# Patient Record
Sex: Male | Born: 1970 | Race: White | Hispanic: No | Marital: Married | Smoking: Never smoker
Health system: Southern US, Community
[De-identification: ages and names within clinical notes are randomized; demographics above are authoritative.]

---

## 2014-04-09 ENCOUNTER — Emergency Department
Admit: 2014-04-09 | Disposition: A | Discharge status: Home / Self Care | Payer: Self-pay | Admitting: Emergency Medicine

## 2015-10-16 ENCOUNTER — Encounter: Payer: Self-pay | Admitting: Emergency Medicine

## 2015-10-16 ENCOUNTER — Emergency Department
Admission: EM | Admit: 2015-10-16 | Discharge: 2015-10-16 | Disposition: A | Discharge status: Home / Self Care | Payer: Medicaid Other | Attending: Emergency Medicine | Admitting: Emergency Medicine

## 2015-10-16 DIAGNOSIS — K047 Periapical abscess without sinus: Secondary | ICD-10-CM | POA: Insufficient documentation

## 2015-10-16 DIAGNOSIS — K0889 Other specified disorders of teeth and supporting structures: Secondary | ICD-10-CM | POA: Diagnosis present

## 2015-10-16 MED ORDER — AMOXICILLIN 500 MG PO CAPS: 500.0000 mg | ORAL_CAPSULE | Freq: Three times a day (TID) | ORAL | 0 refills | Status: DC

## 2015-10-16 MED ORDER — TRAMADOL HCL 50 MG PO TABS: 50.0000 mg | ORAL_TABLET | Freq: Four times a day (QID) | ORAL | 0 refills | Status: DC | PRN

## 2015-10-16 NOTE — ED Notes (Signed)
NAD noted at time of D/C. Pt denies questions or concerns. Pt ambulatory to the lobby at this time.  

## 2015-10-16 NOTE — ED Triage Notes (Signed)
Pt c/o dental pain on the left side. Pt states he has been going to [facility] for dental care but states he thinks he has an infection.  Pt states pain is ongoing.

## 2015-10-16 NOTE — Discharge Instructions (Signed)
Plan take antibiotics as directed. Continue your dental care at [facility] or not.

## 2015-10-16 NOTE — ED Provider Notes (Signed)
[facility] Emergency Department Provider Note  ____________________________________________   First MD Initiated Contact with Patient 10/16/15 1442     (approximate)  I have reviewed the triage vital signs and the nursing notes.   HISTORY  Chief Complaint Dental Pain    HPI Bruce Lindsey is a 45 y.o. male is here with complaint of right-sided dental pain. Patient states that he's been going to [facility] for some time and has been having extractions. He states that several nights ago after eating he noticed some pain on the right lower side where he still has  caries.Patient has been taking aspirin and also using a dental paste to avoid letting air near his tooth. Today he started noticing some swelling on the right side of his face. He denies any fever or chills and there is been no nausea or vomiting. He plans on continuing his ongoing dental procedures at [facility] and getting dentures. Currently he rates his pain as an 8 out of 10.   History reviewed. No pertinent past medical history.  There are no active problems to display for this patient.   History reviewed. No pertinent surgical history.  Prior to Admission medications   Medication Sig Start Date End Date Taking? Authorizing Provider  amoxicillin (AMOXIL) 500 MG capsule Take 1 capsule (500 mg total) by mouth 3 (three) times daily. 10/16/15   Tommi Rumpshonda L Tamakia Porto, PA-C  traMADol (ULTRAM) 50 MG tablet Take 1 tablet (50 mg total) by mouth every 6 (six) hours as needed. 10/16/15   Tommi Rumpshonda L Afomia Blackley, PA-C    Allergies Review of patient's allergies indicates no known allergies.  History reviewed. No pertinent family history.  Social History Social History  Substance Use Topics  . Smoking status: Never Smoker  . Smokeless tobacco: Never Used  . Alcohol use No    Review of Systems Constitutional: No fever/chills ENT: Positive dental pain. Cardiovascular: Denies chest  pain. Respiratory: Denies shortness of breath. Gastrointestinal:   No nausea, no vomiting.   Neurological: Negative for headaches  10-point ROS otherwise negative.  ____________________________________________   PHYSICAL EXAM:  VITAL SIGNS: ED Triage Vitals  Enc Vitals Group     BP 10/16/15 1328 131/88     Pulse Rate 10/16/15 1328 93     Resp 10/16/15 1328 18     Temp 10/16/15 1328 98.1 F (36.7 C)     Temp Source 10/16/15 1328 Oral     SpO2 10/16/15 1328 99 %     Weight 10/16/15 1330 203 lb (92.1 kg)     Height 10/16/15 1330 5\' 9"  (1.753 m)     Head Circumference --      Peak Flow --      Pain Score 10/16/15 1329 8     Pain Loc --      Pain Edu? --      Excl. in GC? --     Constitutional: Alert and oriented. Well appearing and in no acute distress. Eyes: Conjunctivae are normal. PERRL. EOMI. Head: Atraumatic. Nose: No congestion/rhinnorhea. Mouth/Throat: Mucous membranes are moist.  Oropharynx non-erythematous. Overall poor dental hygiene with multiple extractions noted. Right lower molar with dental paste present. The gum surrounding the tooth is edematous and tender to touch. No localized abscess is noted. There is facial edema right mandibular area. Neck: No stridor.   Cardiovascular: Normal rate, regular rhythm. Grossly normal heart sounds.  Good peripheral circulation. Respiratory: Normal respiratory effort.  No retractions. Lungs CTAB. Musculoskeletal:  His upper and lower extremities without any difficulty. Normal gait was noted. Neurologic:  Normal speech and language. No gross focal neurologic deficits are appreciated. No gait instability. Skin:  Skin is warm, dry and intact. No rash noted. Psychiatric: Mood and affect are normal. Speech and behavior are normal.  ____________________________________________   LABS (all labs ordered are listed, but only abnormal results are displayed)  Labs Reviewed - No data to display   PROCEDURES  Procedure(s)  performed: None  Procedures  Critical Care performed: No  ____________________________________________   INITIAL IMPRESSION / ASSESSMENT AND PLAN / ED COURSE  Pertinent labs & imaging results that were available during my care of the patient were reviewed by me and considered in my medical decision making (see chart for details).    Clinical Course   Patient was given a prescription for amoxicillin 500 mg 3 times a day for 10 days and tramadol as needed for pain. Patient is to make appointment with [facility] dental clinic and continue his dental care.   ____________________________________________   FINAL CLINICAL IMPRESSION(S) / ED DIAGNOSES  Final diagnoses:  Dental abscess      NEW MEDICATIONS STARTED DURING THIS VISIT:  New Prescriptions   AMOXICILLIN (AMOXIL) 500 MG CAPSULE    Take 1 capsule (500 mg total) by mouth 3 (three) times daily.   TRAMADOL (ULTRAM) 50 MG TABLET    Take 1 tablet (50 mg total) by mouth every 6 (six) hours as needed.     Note:  This document was prepared using Dragon voice recognition software and may include unintentional dictation errors.    Tommi Rumps, PA-C 10/16/15 1510    Arnaldo Natal, MD 10/20/15 [phone]

## 2019-05-28 DIAGNOSIS — R824 Acetonuria: Secondary | ICD-10-CM | POA: Diagnosis present

## 2019-05-28 DIAGNOSIS — Z20822 Contact with and (suspected) exposure to covid-19: Secondary | ICD-10-CM | POA: Diagnosis present

## 2019-05-28 DIAGNOSIS — K8051 Calculus of bile duct without cholangitis or cholecystitis with obstruction: Secondary | ICD-10-CM | POA: Diagnosis present

## 2019-05-28 DIAGNOSIS — K851 Biliary acute pancreatitis without necrosis or infection: Principal | ICD-10-CM | POA: Diagnosis present

## 2019-05-28 LAB — COMPREHENSIVE METABOLIC PANEL
ALT: 397 U/L — ABNORMAL HIGH (ref 0–44)
AST: 154 U/L — ABNORMAL HIGH (ref 15–41)
Albumin: 4.4 g/dL (ref 3.5–5.0)
Alkaline Phosphatase: 152 U/L — ABNORMAL HIGH (ref 38–126)
Anion gap: 11 (ref 5–15)
BUN: 9 mg/dL (ref 6–20)
CO2: 26 mmol/L (ref 22–32)
Calcium: 9.1 mg/dL (ref 8.9–10.3)
Chloride: 102 mmol/L (ref 98–111)
Creatinine, Ser: 1.09 mg/dL (ref 0.61–1.24)
GFR calc Af Amer: >60 mL/min (ref >60)
GFR calc non Af Amer: >60 mL/min (ref >60)
Glucose, Bld: 140 mg/dL — ABNORMAL HIGH (ref 70–99)
Potassium: 3.9 mmol/L (ref 3.5–5.1)
Sodium: 139 mmol/L (ref 135–145)
Total Bilirubin: 4.3 mg/dL — ABNORMAL HIGH (ref 0.3–1.2)
Total Protein: 8.2 g/dL — ABNORMAL HIGH (ref 6.5–8.1)

## 2019-05-28 LAB — URINALYSIS, COMPLETE (UACMP) WITH MICROSCOPIC
Bacteria, UA: NONE SEEN
Bilirubin Urine: NEGATIVE
Glucose, UA: NEGATIVE mg/dL
Ketones, ur: 20 mg/dL — AB
Leukocytes,Ua: NEGATIVE
Nitrite: NEGATIVE
Protein, ur: NEGATIVE mg/dL
Specific Gravity, Urine: 1.008 (ref 1.005–1.030)
Squamous Epithelial / LPF: NONE SEEN (ref 0–5)
pH: 5 (ref 5.0–8.0)

## 2019-05-28 LAB — CBC
HCT: 51 % (ref 39.0–52.0)
Hemoglobin: 17.2 g/dL — ABNORMAL HIGH (ref 13.0–17.0)
MCH: 29.6 pg (ref 26.0–34.0)
MCHC: 33.7 g/dL (ref 30.0–36.0)
MCV: 87.8 fL (ref 80.0–100.0)
Platelets: 232 10*3/uL (ref 150–400)
RBC: 5.81 MIL/uL (ref 4.22–5.81)
RDW: 13.3 % (ref 11.5–15.5)
WBC: 14.8 10*3/uL — ABNORMAL HIGH (ref 4.0–10.5)
nRBC: 0 % (ref 0.0–0.2)

## 2019-05-28 LAB — LIPASE, BLOOD: Lipase: 5577 U/L — ABNORMAL HIGH (ref 11–51)

## 2019-05-28 NOTE — ED Triage Notes (Signed)
Generalized abd cramping x 2 days with 2 episodes of vomiting. Nausea is intermittent in nature and pt reports he has been able to keep some fluids and food down. No diarrhea or fevers and no changes in urine.

## 2019-05-29 ENCOUNTER — Inpatient Hospital Stay (HOSPITAL_COMMUNITY)
Admission: AD | Admit: 2019-05-29 | Discharge: 2019-05-31 | DRG: 440 | Disposition: A | Discharge status: Home / Self Care | Payer: Self-pay | Source: Other Acute Inpatient Hospital | Attending: Internal Medicine | Admitting: Internal Medicine

## 2019-05-29 ENCOUNTER — Emergency Department: Payer: Self-pay

## 2019-05-29 ENCOUNTER — Inpatient Hospital Stay
Admission: EM | Admit: 2019-05-29 | Discharge: 2019-05-29 | DRG: 439 | Disposition: A | Discharge status: Other Acute Inpatient Hospital | Payer: Self-pay | Attending: Internal Medicine | Admitting: Internal Medicine

## 2019-05-29 DIAGNOSIS — E876 Hypokalemia: Secondary | ICD-10-CM | POA: Diagnosis present

## 2019-05-29 DIAGNOSIS — Z79899 Other long term (current) drug therapy: Secondary | ICD-10-CM

## 2019-05-29 DIAGNOSIS — K8051 Calculus of bile duct without cholangitis or cholecystitis with obstruction: Secondary | ICD-10-CM

## 2019-05-29 DIAGNOSIS — D72829 Elevated white blood cell count, unspecified: Secondary | ICD-10-CM | POA: Diagnosis present

## 2019-05-29 DIAGNOSIS — K8070 Calculus of gallbladder and bile duct without cholecystitis without obstruction: Secondary | ICD-10-CM | POA: Diagnosis present

## 2019-05-29 DIAGNOSIS — Z20822 Contact with and (suspected) exposure to covid-19: Secondary | ICD-10-CM | POA: Diagnosis present

## 2019-05-29 DIAGNOSIS — K851 Biliary acute pancreatitis without necrosis or infection: Principal | ICD-10-CM | POA: Diagnosis present

## 2019-05-29 DIAGNOSIS — R7989 Other specified abnormal findings of blood chemistry: Secondary | ICD-10-CM

## 2019-05-29 DIAGNOSIS — K8050 Calculus of bile duct without cholangitis or cholecystitis without obstruction: Secondary | ICD-10-CM

## 2019-05-29 DIAGNOSIS — R17 Unspecified jaundice: Secondary | ICD-10-CM

## 2019-05-29 LAB — SARS CORONAVIRUS 2 BY RT PCR (HOSPITAL ORDER, PERFORMED IN ~~LOC~~ HOSPITAL LAB): SARS Coronavirus 2: NEGATIVE

## 2019-05-29 MED ORDER — SODIUM CHLORIDE 0.9 % IV BOLUS
1000.0000 mL | Freq: Once | INTRAVENOUS | Status: AC
  Administered 2019-05-29: 1000 mL via INTRAVENOUS

## 2019-05-29 MED ORDER — ONDANSETRON HCL 4 MG PO TABS: 4.0000 mg | ORAL_TABLET | Freq: Four times a day (QID) | ORAL | Status: DC | PRN

## 2019-05-29 MED ORDER — ONDANSETRON HCL 4 MG/2ML IJ SOLN
4.0000 mg | Freq: Four times a day (QID) | INTRAMUSCULAR | Status: DC | PRN
Start: 2019-05-29 — End: 2019-05-31
  Administered 2019-05-30: 4 mg via INTRAVENOUS

## 2019-05-29 MED ORDER — DEXTROSE IN LACTATED RINGERS 5 % IV SOLN: INTRAVENOUS | Status: DC

## 2019-05-29 MED ORDER — SODIUM CHLORIDE 0.9 % IV SOLN
2.0000 g | Freq: Once | INTRAVENOUS | Status: AC
  Administered 2019-05-29: 2 g via INTRAVENOUS
  Filled 2019-05-29: qty 20

## 2019-05-29 MED ORDER — HYDROMORPHONE HCL 1 MG/ML IJ SOLN: 0.5000 mg | INTRAMUSCULAR | Status: DC | PRN

## 2019-05-29 MED ORDER — SODIUM CHLORIDE 0.9 % IV SOLN: Freq: Once | INTRAVENOUS | Status: AC

## 2019-05-29 MED ORDER — MORPHINE SULFATE (PF) 4 MG/ML IV SOLN
4.0000 mg | Freq: Once | INTRAVENOUS | Status: AC
Start: 2019-05-29 — End: 2019-05-29
  Administered 2019-05-29: 4 mg via INTRAVENOUS
  Filled 2019-05-29: qty 1

## 2019-05-29 MED ORDER — IOHEXOL 300 MG/ML  SOLN
100.0000 mL | Freq: Once | INTRAMUSCULAR | Status: AC | PRN
  Administered 2019-05-29: 100 mL via INTRAVENOUS

## 2019-05-29 MED ORDER — ENOXAPARIN SODIUM 40 MG/0.4ML ~~LOC~~ SOLN: 40.0000 mg | SUBCUTANEOUS | Status: DC

## 2019-05-29 MED ORDER — ONDANSETRON HCL 4 MG/2ML IJ SOLN
4.0000 mg | INTRAMUSCULAR | Status: AC
  Administered 2019-05-29: 4 mg via INTRAVENOUS
  Filled 2019-05-29: qty 2

## 2019-05-29 MED ORDER — MORPHINE SULFATE (PF) 4 MG/ML IV SOLN
4.0000 mg | INTRAVENOUS | Status: DC | PRN
  Administered 2019-05-29: 4 mg via INTRAVENOUS
  Filled 2019-05-29: qty 1

## 2019-05-29 NOTE — ED Provider Notes (Signed)
[facility] Emergency Department Provider Note  ____________________________________________   First MD Initiated Contact with Patient 05/29/19 0149     (approximate)  I have reviewed the triage vital signs and the nursing notes.   HISTORY  Chief Complaint Abdominal Pain    HPI Bruce Lindsey is a 49 y.o. male reports no chronic medical issues and no history of alcohol or tobacco use.  He presents for evaluation of episodes of generalized abdominal cramping and dull abdominal pain associated with nausea and 2 episodes of vomiting over the last 2 days.  The onset was acute but the symptoms are intermittent and right now he feels okay.  He has been able to eat and drink and in fact he says he ate a hamburger earlier today.  The symptoms come and go for no particular reason and nothing in particular makes his symptoms better or worse including eating.  He has had no diarrhea.  He denies fever/chills, sore throat, chest pain, shortness of breath.  He has not had similar issues in the past.  He has no history of alcohol use, no history of substance abuse, no history of gallbladder disease or liver disease.  He describes his symptoms as occasionally severe but mostly mild and currently absent.         History reviewed. No pertinent past medical history.  Patient Active Problem List   Diagnosis Date Noted  . Acute gallstone pancreatitis 05/29/2019    No past surgical history on file.  Prior to Admission medications   Medication Sig Start Date End Date Taking? Authorizing Provider  amoxicillin (AMOXIL) 500 MG capsule Take 1 capsule (500 mg total) by mouth 3 (three) times daily. 10/16/15   Tommi Rumps, PA-C  traMADol (ULTRAM) 50 MG tablet Take 1 tablet (50 mg total) by mouth every 6 (six) hours as needed. 10/16/15   Tommi Rumps, PA-C    Allergies Patient has no known allergies.  No family history on file.  Social History Social History    Tobacco Use  . Smoking status: Never Smoker  . Smokeless tobacco: Never Used  Substance Use Topics  . Alcohol use: No  . Drug use: Not on file    Review of Systems Constitutional: No fever/chills Eyes: No visual changes. ENT: No sore throat. Cardiovascular: Denies chest pain. Respiratory: Denies shortness of breath. Gastrointestinal: 2 days of episodic dull abdominal pain and cramping associated with nausea and vomiting. Genitourinary: Negative for dysuria. Musculoskeletal: Negative for neck pain.  Negative for back pain. Integumentary: Negative for rash. Neurological: Negative for headaches, focal weakness or numbness.   ____________________________________________   PHYSICAL EXAM:  VITAL SIGNS: ED Triage Vitals [05/28/19 2026]  Enc Vitals Group     BP (!) 148/88     Pulse Rate 80     Resp 16     Temp 98.9 F (37.2 C)     Temp Source Oral     SpO2 97 %     Weight 95.3 kg (210 lb)     Height 1.753 m (5\' 9" )     Head Circumference      Peak Flow      Pain Score 7     Pain Loc      Pain Edu?      Excl. in GC?     Constitutional: Alert and oriented.  Eyes: Conjunctivae are normal.  Head: Atraumatic. Nose: No congestion/rhinnorhea. Mouth/Throat: Patient is wearing a mask. Neck: No stridor.  No meningeal  signs.   Cardiovascular: Normal rate, regular rhythm. Good peripheral circulation. Grossly normal heart sounds. Respiratory: Normal respiratory effort.  No retractions. Gastrointestinal: Soft and nondistended.  There is some mild diffuse tenderness to palpation in the supraumbilical region but no epigastric tenderness, no right upper quadrant tenderness, negative Murphy sign.  No lower abdominal tenderness with no rebound and no guarding. Musculoskeletal: No lower extremity tenderness nor edema. No gross deformities of extremities. Neurologic:  Normal speech and language. No gross focal neurologic deficits are appreciated.  Skin:  Skin is warm, dry and  intact. Psychiatric: Mood and affect are normal. Speech and behavior are normal.  ____________________________________________   LABS (all labs ordered are listed, but only abnormal results are displayed)  Labs Reviewed  LIPASE, BLOOD - Abnormal; Notable for the following components:      Result Value   Lipase 5,577 (*)    All other components within normal limits  COMPREHENSIVE METABOLIC PANEL - Abnormal; Notable for the following components:   Glucose, Bld 140 (*)    Total Protein 8.2 (*)    AST 154 (*)    ALT 397 (*)    Alkaline Phosphatase 152 (*)    Total Bilirubin 4.3 (*)    All other components within normal limits  CBC - Abnormal; Notable for the following components:   WBC 14.8 (*)    Hemoglobin 17.2 (*)    All other components within normal limits  URINALYSIS, COMPLETE (UACMP) WITH MICROSCOPIC - Abnormal; Notable for the following components:   Color, Urine YELLOW (*)    APPearance CLEAR (*)    Hgb urine dipstick SMALL (*)    Ketones, ur 20 (*)    All other components within normal limits  SARS CORONAVIRUS 2 BY RT PCR (HOSPITAL ORDER, [facility])   ____________________________________________  EKG  No indication for emergent EKG ____________________________________________  RADIOLOGY I, Hinda Kehr, personally viewed and evaluated these images (plain radiographs) as part of my medical decision making, as well as reviewing the written report by the radiologist.  ED MD interpretation: Cholelithiasis without cholecystitis on ultrasound.  CT demonstrates cholelithiasis and choledocholithiasis as well as edema consistent with his diagnosis of pancreatitis.  Official radiology report(s): CT ABDOMEN PELVIS W CONTRAST  Result Date: 05/29/2019 CLINICAL DATA:  Nausea and vomiting with pancreatitis suspected EXAM: CT ABDOMEN AND PELVIS WITH CONTRAST TECHNIQUE: Multidetector CT imaging of the abdomen and pelvis was performed using the standard  protocol following bolus administration of intravenous contrast. CONTRAST:  183mL OMNIPAQUE IOHEXOL 300 MG/ML  SOLN COMPARISON:  05/29/2019 ultrasound FINDINGS: Lower chest:  No contributory findings. Hepatobiliary: No focal liver abnormality.Cholelithiasis. No evidence of acute cholecystitis. Two distal CBD calculi with no ductal dilatation. The larger measures 7 x 3 mm at the pancreatic head. The smaller is punctate and at the level of the punctum. Pancreas: Edema at the root of the small bowel mesentery likely related to pancreatitis, although the pancreatic parenchyma does not appear expanded or edematous, there is notably elevated lipase. No collection or necrosis. Spleen: Unremarkable. Adrenals/Urinary Tract: Negative adrenals. No hydronephrosis or stone. Unremarkable bladder. Stomach/Bowel: No obstruction. No appendicitis. Distal colonic diverticulosis. Vascular/Lymphatic: No acute vascular abnormality. Multifocal atherosclerotic calcification. No mass or adenopathy. Reproductive:No pathologic findings. Other: No ascites or pneumoperitoneum. Musculoskeletal: No acute abnormalities. IMPRESSION: Cholelithiasis and choledocholithiasis with edema in the subperitoneal fat presumably related to the known pancreatitis. Electronically Signed   By: Monte Fantasia M.D.   On: 05/29/2019 04:38   US ABDOMEN LIMITED  RUQ  Result Date: 05/29/2019 CLINICAL DATA:  Initial evaluation for acute abdominal pain, nausea, vomiting, elevated LFTs, pancreatitis. EXAM: ULTRASOUND ABDOMEN LIMITED RIGHT UPPER QUADRANT COMPARISON:  None. FINDINGS: Gallbladder: Multiple echogenic stones seen within the gallbladder lumen, largest of which measures 7 mm. Gallbladder wall measures within normal limits at 2.1 mm. No free pericholecystic fluid. No sonographic Murphy sign elicited on exam. Common bile duct: Diameter: 3.7 mm Liver: No focal lesion identified. Within normal limits in parenchymal echogenicity. Portal vein is patent on color  Doppler imaging with normal direction of blood flow towards the liver. Other: None. IMPRESSION: 1. Cholelithiasis. No sonographic features to suggest acute cholecystitis. 2. No biliary dilatation. Electronically Signed   By: Rise Mu M.D.   On: 05/29/2019 03:26    ____________________________________________   PROCEDURES   Procedure(s) performed (including Critical Care):  Procedures   ____________________________________________   INITIAL IMPRESSION / MDM / ASSESSMENT AND PLAN / ED COURSE  As part of my medical decision making, I reviewed the following data within the electronic MEDICAL RECORD NUMBER Nursing notes reviewed and incorporated, Labs reviewed , Old chart reviewed, Discussed with admitting physician (Dr. Jonah Blue) and reviewed Notes from prior ED visits   Differential diagnosis includes, but is not limited to, biliary colic, pancreatitis, neoplasm, diverticulitis.  The patient's vital signs are stable with no tachycardia and no fever.  His lab work is notable for some mild ketones in his urine but most notable for elevated LFTs including a T bili of 4.3, leukocytosis of 14.8, and most notably a lipase of 5577.  His lab work appears consistent with choledocholithiasis and resulting pancreatitis, but his clinical presentation is not at all consistent with this.  He has no tenderness to palpation of his abdomen and he has been able to eat and drink.  I explained this to the patient and that he needs some additional work-up.  We are proceeding with a right upper quadrant ultrasound but he may also need a CT scan.  He agrees with the plan.  He currently needs no analgesia nor antiemetics.       Clinical Course as of May 29 738  Fri May 29, 2019  1275 Ultrasound was generally unremarkable with no sign of acute abnormality including no ductal dilatation and no cholecystitis.  The patient still feels fine.  I updated him about the results and encouraged him to  allow [place] to get a CT scan of the abdomen and pelvis given his acute lab abnormalities.  He does not really want to stay in the hospital but agreed to additional imaging and then we will reassess the need for hospitalization versus close outpatient follow-up.   [CF]  0459 Cholelithiasis and choledocholithiasis with edema in the subperitoneal fat presumably related to the known pancreatitis.  I paged GI to discuss the case.  Patient will likely need an ERCP but it is unclear if Dr. Servando Snare is on the schedule.  I updated the patient and his wife at bedside and they understand the situation.  CT ABDOMEN PELVIS W CONTRAST [CF]  1700 Discussed case by phone with Dr. Calton Golds with GI.  She is going to investigate whether or not Dr. Servando Snare will be available for ERCP and call me back.  I updated the patient and his wife at bedside.   [CF]  0602 I spoke by phone with Dr. Stan Head, GI specialist at [facility].  He agreed with the need for transfer and ERCP.  He agreed with my plan  for IV fluids, n.p.o. status, and analgesia and antiemetics as needed.  [facility] informed me that there are no beds available at [facility] or [facility].  As per patient request, I also checked [facility] and all of the [facility] hospitals are on diversion.  We will continue to wait for a bed at [facility] and I am awaiting a callback from the hospitalist service.   [CF]  0603 Patient and wife updated.  Having pain now, getting morphine 4 mg IV and Zofran 4 mg IV.  Also getting 1000 mL NS IV bolus, followed by 100 mL/hr NS infusion.   [CF]  [phone] Still awaiting callback from [facility] hospitalist.  COVID-19 PCR test is negative.  He is receiving IV fluids.  Given the uncertain length of delay and his nonspecific leukocytosis, I have opted to give him a dose of ceftriaxone 2 g IV as per the [facility] ED antibiotic order set recommendation for low risk cholecystitis.   [CF]  [phone] Spoke by phone with Dr. Jonah Blue [facility] hospitalist).  We  discussed the case in detail and she accepted the patient.  Bed request is pending.  I will update the patient and his wife.   [CF]    Clinical Course User Index [CF] Loleta Rose, MD     ____________________________________________  FINAL CLINICAL IMPRESSION(S) / ED DIAGNOSES  Final diagnoses:  Calculus of bile duct without cholecystitis with obstruction  Acute biliary pancreatitis without infection or necrosis  Elevated LFTs  Elevated bilirubin     MEDICATIONS GIVEN DURING THIS VISIT:  Medications  0.9 %  sodium chloride infusion (has no administration in time range)  cefTRIAXone (ROCEPHIN) 2 g in sodium chloride 0.9 % 100 mL IVPB (has no administration in time range)  iohexol (OMNIPAQUE) 300 MG/ML solution 100 mL (100 mLs Intravenous Contrast Given 05/29/19 0414)  morphine 4 MG/ML injection 4 mg (4 mg Intravenous Given 05/29/19 0558)  ondansetron (ZOFRAN) injection 4 mg (4 mg Intravenous Given 05/29/19 0558)  sodium chloride 0.9 % bolus 1,000 mL (1,000 mLs Intravenous New Bag/Given 05/29/19 0559)     ED Discharge Orders    None      *Please note:  Bruce Lindsey was evaluated in Emergency Department on 05/29/2019 for the symptoms described in the history of present illness. He was evaluated in the context of the global COVID-19 pandemic, which necessitated consideration that the patient might be at risk for infection with the SARS-CoV-2 virus that causes COVID-19. Institutional protocols and algorithms that pertain to the evaluation of patients at risk for COVID-19 are in a state of rapid change based on information released by regulatory bodies including the CDC and federal and state organizations. These policies and algorithms were followed during the patient's care in the ED.  Some ED evaluations and interventions may be delayed as a result of limited staffing during the pandemic.*  Note:  This document was prepared using Dragon voice recognition software and may include  unintentional dictation errors.   Loleta Rose, MD 05/29/19 [phone]

## 2019-05-29 NOTE — Progress Notes (Signed)
Pt just arrived by [facility] from [facility]. Oriented to room and surroundings-gait steady.

## 2019-05-29 NOTE — ED Notes (Signed)
[facility] in with pt now  Pt alert  Iv fluids infusing.

## 2019-05-29 NOTE — ED Notes (Signed)
Pt transported to [facility]

## 2019-05-29 NOTE — ED Notes (Signed)
Pt and wife updated on status of bed at [facility]. Advised to remain NPO per provider

## 2019-05-29 NOTE — Progress Notes (Signed)
[facility] to [facility] transfer:  Patient without significant known medical problems presenting with gallstone pancreatitis.  GI is unable to do ERCP at their facility.  Having intermittent abdominal pain, n/v.  Labs with elevated LFTs, bili 4.3, lipase 5600.  Abdominal pain unremarkable.  [place] with stones.  CT with choledocholithiasis and CBD stones, pancreatitis.  Dr. Leone Payor agrees to see patient upon arrival.  Will accept to med surg.  NPO, Rocephin, IVF.  COVID negative.   Georgana Curio, M.D.

## 2019-05-29 NOTE — H&P (Signed)
History and Physical   Bruce Lindsey JKK:[id number] DOB: 1970/08/08 DOA: 05/29/2019  Referring MD/NP/PA: Dr. Salvad[facility]  PCP: Patient, No Pcp Per   Outpatient Specialists: None  Patient coming from: [facility]  Chief Complaint: Abdominal pain  HPI: Bruce Lindsey is a 49 y.o. male with medical history significant of no significant past medical history who presented to [facility] ER with abdominal pain nausea and vomiting for 2 days.  Pain is located in the epigastric region.  Sharp.  Rated as 9 out of 10.  Not relieved by anything except for pain medication in the ER.  Associated with bloating.  Symptoms are intermittent.  Denied any diarrhea.  Denied any melena or bright red blood per rectum.  No hematemesis.  Patient was seen and evaluated.  Patient denies alcohol or tobacco use.  Lipid panel is currently unknown.  Findings: Found acute pancreatitis.  Also evidence of gallstones.  Lipase more than 5000.  Patient is requiring ERCP which is not available over there so patient transferred to this hospital.  Dr. Leone Payor to see patient.  At this point is complaining of pain otherwise hemodynamically stable..  ED Course: Temperature 98.9 blood pressure 140/88 pulse 80.  Respiratory rate of 16 oxygen sat 95% on room air.  Urinalysis is negative.  Chemistry showed normal electrolytes, glucose is 140.  Alkaline phosphatase 152.  Albumin 4.41 lipase 5577.  AST 154 and ALT 397.  Total bilirubin is 8.2.  White count is 14,000.  CT abdomen pelvis showed cholelithiasis and choledocholithiasis with edema in the subperitoneal fat consistent with pancreatitis. Right upper quadrant abdominal ultrasound showed cholelithiasis.  No acute cholecystitis and no biliary dilatation.  Patient therefore being admitted with acute gallstone pancreatitis.  Review of Systems: As per HPI otherwise 10 point review of systems negative.    No past medical history on file.  No past  surgical history on file.   reports that he has never smoked. He has never used smokeless tobacco. He reports that he does not drink alcohol. No history on file for drug.  No Known Allergies  No family history on file.   Prior to Admission medications   Not on File    Physical Exam: Vitals:   05/29/19 1926  BP: (!) 151/95  Pulse: 100  Resp: 18  Temp: 98.5 F (36.9 C)  TempSrc: Oral  SpO2: 100%  Weight: 90.3 kg  Height: 5\' 9"  (1.753 m)      Constitutional: NAD, calm, comfortable Vitals:   05/29/19 1926  BP: (!) 151/95  Pulse: 100  Resp: 18  Temp: 98.5 F (36.9 C)  TempSrc: Oral  SpO2: 100%  Weight: 90.3 kg  Height: 5\' 9"  (1.753 m)   Eyes: PERRL, lids and conjunctivae normal ENMT: Mucous membranes are moist. Posterior pharynx clear of any exudate or lesions.Normal dentition.  Neck: normal, supple, no masses, no thyromegaly Respiratory: clear to auscultation bilaterally, no wheezing, no crackles. Normal respiratory effort. No accessory muscle use.  Cardiovascular: Regular rate and rhythm, no murmurs / rubs / gallops. No extremity edema. 2+ pedal pulses. No carotid bruits.  Abdomen: Epigastric tenderness, no masses palpated. No hepatosplenomegaly. Bowel sounds positive.  Musculoskeletal: no clubbing / cyanosis. No joint deformity upper and lower extremities. Good ROM, no contractures. Normal muscle tone.  Skin: no rashes, lesions, ulcers. No induration Neurologic: CN 2-12 grossly intact. Sensation intact, DTR normal. Strength 5/5 in all 4.  Psychiatric: Normal judgment and insight. Alert and oriented x 3.  Normal mood.     Labs on Admission: I have personally reviewed following labs and imaging studies  CBC: Recent Labs  Lab 05/28/19 2029  WBC 14.8*  HGB 17.2*  HCT 51.0  MCV 87.8  PLT 232   Basic Metabolic Panel: Recent Labs  Lab 05/28/19 2029  NA 139  K 3.9  CL 102  CO2 26  GLUCOSE 140*  BUN 9  CREATININE 1.09  CALCIUM 9.1   GFR: Estimated  Creatinine Clearance: 91 mL/min (by C-G formula based on SCr of 1.09 mg/dL). Liver Function Tests: Recent Labs  Lab 05/28/19 2029  AST 154*  ALT 397*  ALKPHOS 152*  BILITOT 4.3*  PROT 8.2*  ALBUMIN 4.4   Recent Labs  Lab 05/28/19 2029  LIPASE 5,577*   No results for input(s): AMMONIA in the last 168 hours. Coagulation Profile: No results for input(s): INR, PROTIME in the last 168 hours. Cardiac Enzymes: No results for input(s): CKTOTAL, CKMB, CKMBINDEX, TROPONINI in the last 168 hours. BNP (last 3 results) No results for input(s): PROBNP in the last 8760 hours. HbA1C: No results for input(s): HGBA1C in the last 72 hours. CBG: No results for input(s): GLUCAP in the last 168 hours. Lipid Profile: No results for input(s): CHOL, HDL, LDLCALC, TRIG, CHOLHDL, LDLDIRECT in the last 72 hours. Thyroid Function Tests: No results for input(s): TSH, T4TOTAL, FREET4, T3FREE, THYROIDAB in the last 72 hours. Anemia Panel: No results for input(s): VITAMINB12, FOLATE, FERRITIN, TIBC, IRON, RETICCTPCT in the last 72 hours. Urine analysis:    Component Value Date/Time   COLORURINE YELLOW (A) 05/28/2019 2029   APPEARANCEUR CLEAR (A) 05/28/2019 2029   LABSPEC 1.008 05/28/2019 2029   PHURINE 5.0 05/28/2019 2029   GLUCOSEU NEGATIVE 05/28/2019 2029   HGBUR SMALL (A) 05/28/2019 2029   BILIRUBINUR NEGATIVE 05/28/2019 2029   KETONESUR 20 (A) 05/28/2019 2029   PROTEINUR NEGATIVE 05/28/2019 2029   NITRITE NEGATIVE 05/28/2019 2029   LEUKOCYTESUR NEGATIVE 05/28/2019 2029   Sepsis Labs: @LABRCNTIP (procalcitonin:4,lacticidven:4) ) Recent Results (from the past 240 hour(s))  SARS Coronavirus 2 by RT PCR (hospital order, performed in [facility] hospital lab) Nasopharyngeal Nasopharyngeal Swab     Status: None   Collection Time: 05/29/19  5:59 AM   Specimen: Nasopharyngeal Swab  Result Value Ref Range Status   SARS Coronavirus 2 NEGATIVE NEGATIVE Final    Comment: (NOTE) SARS-CoV-2 target  nucleic acids are NOT DETECTED. The SARS-CoV-2 RNA is generally detectable in upper and lower respiratory specimens during the acute phase of infection. The lowest concentration of SARS-CoV-2 viral copies this assay can detect is 250 copies / mL. A negative result does not preclude SARS-CoV-2 infection and should not be used as the sole basis for treatment or other patient management decisions.  A negative result may occur with improper specimen collection / handling, submission of specimen other than nasopharyngeal swab, presence of viral mutation(s) within the areas targeted by this assay, and inadequate number of viral copies (<250 copies / mL). A negative result must be combined with clinical observations, patient history, and epidemiological information. Fact Sheet for Patients:   05/31/19 Fact Sheet for Healthcare Providers: BoilerBrush.com.cy This test is not yet approved or cleared  by the https://pope.com/ FDA and has been authorized for detection and/or diagnosis of SARS-CoV-2 by FDA under an Emergency Use Authorization (EUA).  This EUA will remain in effect (meaning this test can be used) for the duration of the COVID-19 declaration under Section 564(b)(1) of the Act, 21 U.S.C. section  360bbb-3(b)(1), unless the authorization is terminated or revoked sooner. Performed at [facility], [address] [place], [place] [address]      Radiological Exams on Admission: CT ABDOMEN PELVIS W CONTRAST  Result Date: 05/29/2019 CLINICAL DATA:  Nausea and vomiting with pancreatitis suspected EXAM: CT ABDOMEN AND PELVIS WITH CONTRAST TECHNIQUE: Multidetector CT imaging of the abdomen and pelvis was performed using the standard protocol following bolus administration of intravenous contrast. CONTRAST:  19mL OMNIPAQUE IOHEXOL 300 MG/ML  SOLN COMPARISON:  05/29/2019 ultrasound FINDINGS: Lower chest:  No contributory findings.  Hepatobiliary: No focal liver abnormality.Cholelithiasis. No evidence of acute cholecystitis. Two distal CBD calculi with no ductal dilatation. The larger measures 7 x 3 mm at the pancreatic head. The smaller is punctate and at the level of the punctum. Pancreas: Edema at the root of the small bowel mesentery likely related to pancreatitis, although the pancreatic parenchyma does not appear expanded or edematous, there is notably elevated lipase. No collection or necrosis. Spleen: Unremarkable. Adrenals/Urinary Tract: Negative adrenals. No hydronephrosis or stone. Unremarkable bladder. Stomach/Bowel: No obstruction. No appendicitis. Distal colonic diverticulosis. Vascular/Lymphatic: No acute vascular abnormality. Multifocal atherosclerotic calcification. No mass or adenopathy. Reproductive:No pathologic findings. Other: No ascites or pneumoperitoneum. Musculoskeletal: No acute abnormalities. IMPRESSION: Cholelithiasis and choledocholithiasis with edema in the subperitoneal fat presumably related to the known pancreatitis. Electronically Signed   By: Monte Fantasia M.D.   On: 05/29/2019 04:38   US ABDOMEN LIMITED RUQ  Result Date: 05/29/2019 CLINICAL DATA:  Initial evaluation for acute abdominal pain, nausea, vomiting, elevated LFTs, pancreatitis. EXAM: ULTRASOUND ABDOMEN LIMITED RIGHT UPPER QUADRANT COMPARISON:  None. FINDINGS: Gallbladder: Multiple echogenic stones seen within the gallbladder lumen, largest of which measures 7 mm. Gallbladder wall measures within normal limits at 2.1 mm. No free pericholecystic fluid. No sonographic Murphy sign elicited on exam. Common bile duct: Diameter: 3.7 mm Liver: No focal lesion identified. Within normal limits in parenchymal echogenicity. Portal vein is patent on color Doppler imaging with normal direction of blood flow towards the liver. Other: None. IMPRESSION: 1. Cholelithiasis. No sonographic features to suggest acute cholecystitis. 2. No biliary dilatation.  Electronically Signed   By: Jeannine Boga M.D.   On: 05/29/2019 03:26      Assessment/Plan Principal Problem:   Acute gallstone pancreatitis Active Problems:   Leucocytosis   Pancreatitis, gallstone     #1 acute gallstone pancreatitis: Patient will be admitted to the hospital for management.  Complete n.p.o.  IV fluids with Ringer's lactate.  Pain management.  Manage nausea with vomiting.  Gastroenterology consulted for possible ERCP or MRCP.  Continue close monitoring.  Check lipid panel.  #2 leukocytosis: Secondary to acute pancreatitis.  Follow white count and manage.   DVT prophylaxis: Lovenox Code Status: Full code Family Communication: No family at bedside Disposition Plan: Home Consults called: Gastroenterology Dr. Arelia Longest Admission status: Inpatient  Severity of Illness: The appropriate patient status for this patient is INPATIENT. Inpatient status is judged to be reasonable and necessary in order to provide the required intensity of service to ensure the patient's safety. The patient's presenting symptoms, physical exam findings, and initial radiographic and laboratory data in the context of their chronic comorbidities is felt to place them at high risk for further clinical deterioration. Furthermore, it is not anticipated that the patient will be medically stable for discharge from the hospital within 2 midnights of admission. The following factors support the patient status of inpatient.   " The patient's presenting symptoms include abdominal pain with  nausea vomiting. " The worrisome physical exam findings include mid abdominal tenderness. " The initial radiographic and laboratory data are worrisome because of elevated lipase and LFTs. " The chronic co-morbidities include no significant past medical history.   * I certify that at the point of admission it is my clinical judgment that the patient will require inpatient hospital care spanning beyond 2 midnights  from the point of admission due to high intensity of service, high risk for further deterioration and high frequency of surveillance required.Lonia Blood MD [facility] Pager [phone]  If 7PM-7AM, please contact night-coverage [email or web address] Password [facility]  05/29/2019, 8:23 PM

## 2019-05-29 NOTE — Progress Notes (Signed)
Dr. Christella Hartigan  of [facility] notified of consult for pt per Dr. Mikeal Hawthorne request. He stated that he would see him in the a.m.Marland Kitchen

## 2019-05-29 NOTE — ED Notes (Signed)
Consent signed by patient for transfer.

## 2019-05-29 NOTE — ED Notes (Signed)
Pt alert and watching tv.  Denies pain.  Iv fluids infusing.

## 2019-05-30 ENCOUNTER — Inpatient Hospital Stay (HOSPITAL_COMMUNITY): Payer: Self-pay | Admitting: Anesthesiology

## 2019-05-30 ENCOUNTER — Encounter (HOSPITAL_COMMUNITY)
Admission: AD | Disposition: A | Discharge status: Home / Self Care | Payer: Self-pay | Source: Other Acute Inpatient Hospital | Attending: Internal Medicine

## 2019-05-30 ENCOUNTER — Encounter (HOSPITAL_COMMUNITY): Payer: Self-pay | Admitting: Internal Medicine

## 2019-05-30 ENCOUNTER — Inpatient Hospital Stay (HOSPITAL_COMMUNITY): Payer: Self-pay

## 2019-05-30 DIAGNOSIS — K8050 Calculus of bile duct without cholangitis or cholecystitis without obstruction: Secondary | ICD-10-CM

## 2019-05-30 DIAGNOSIS — K851 Biliary acute pancreatitis without necrosis or infection: Principal | ICD-10-CM

## 2019-05-30 HISTORY — PX: SPHINCTEROTOMY: SHX5544

## 2019-05-30 HISTORY — PX: ERCP: SHX5425

## 2019-05-30 LAB — CBC
HCT: 48.1 % (ref 39.0–52.0)
Hemoglobin: 15.8 g/dL (ref 13.0–17.0)
MCH: 29.8 pg (ref 26.0–34.0)
MCHC: 32.8 g/dL (ref 30.0–36.0)
MCV: 90.8 fL (ref 80.0–100.0)
Platelets: 194 10*3/uL (ref 150–400)
RBC: 5.3 MIL/uL (ref 4.22–5.81)
RDW: 13.6 % (ref 11.5–15.5)
WBC: 11.2 10*3/uL — ABNORMAL HIGH (ref 4.0–10.5)
nRBC: 0 % (ref 0.0–0.2)

## 2019-05-30 LAB — COMPREHENSIVE METABOLIC PANEL
ALT: 250 U/L — ABNORMAL HIGH (ref 0–44)
AST: 77 U/L — ABNORMAL HIGH (ref 15–41)
Albumin: 3.5 g/dL (ref 3.5–5.0)
Alkaline Phosphatase: 135 U/L — ABNORMAL HIGH (ref 38–126)
Anion gap: 8 (ref 5–15)
BUN: 6 mg/dL (ref 6–20)
CO2: 27 mmol/L (ref 22–32)
Calcium: 8.9 mg/dL (ref 8.9–10.3)
Chloride: 103 mmol/L (ref 98–111)
Creatinine, Ser: 0.92 mg/dL (ref 0.61–1.24)
GFR calc Af Amer: >60 mL/min (ref >60)
GFR calc non Af Amer: >60 mL/min (ref >60)
Glucose, Bld: 114 mg/dL — ABNORMAL HIGH (ref 70–99)
Potassium: 3.4 mmol/L — ABNORMAL LOW (ref 3.5–5.1)
Sodium: 138 mmol/L (ref 135–145)
Total Bilirubin: 1.7 mg/dL — ABNORMAL HIGH (ref 0.3–1.2)
Total Protein: 7.1 g/dL (ref 6.5–8.1)

## 2019-05-30 LAB — HIV ANTIBODY (ROUTINE TESTING W REFLEX): HIV Screen 4th Generation wRfx: NONREACTIVE

## 2019-05-30 LAB — LIPASE, BLOOD: Lipase: 111 U/L — ABNORMAL HIGH (ref 11–51)

## 2019-05-30 SURGERY — ERCP, WITH INTERVENTION IF INDICATED
Anesthesia: General

## 2019-05-30 MED ORDER — LACTATED RINGERS IV SOLN: INTRAVENOUS | Status: DC | PRN

## 2019-05-30 MED ORDER — LACTATED RINGERS IV SOLN
INTRAVENOUS | Status: AC | PRN
  Administered 2019-05-30: 1000 mL via INTRAVENOUS

## 2019-05-30 MED ORDER — ROCURONIUM BROMIDE 10 MG/ML (PF) SYRINGE
PREFILLED_SYRINGE | INTRAVENOUS | Status: DC | PRN
  Administered 2019-05-30: 50 mg via INTRAVENOUS

## 2019-05-30 MED ORDER — FENTANYL CITRATE (PF) 100 MCG/2ML IJ SOLN: 25.0000 ug | INTRAMUSCULAR | Status: DC | PRN

## 2019-05-30 MED ORDER — SUGAMMADEX SODIUM 200 MG/2ML IV SOLN
INTRAVENOUS | Status: DC | PRN
  Administered 2019-05-30: 200 mg via INTRAVENOUS

## 2019-05-30 MED ORDER — FENTANYL CITRATE (PF) 250 MCG/5ML IJ SOLN
INTRAMUSCULAR | Status: DC | PRN
  Administered 2019-05-30: 100 ug via INTRAVENOUS

## 2019-05-30 MED ORDER — CIPROFLOXACIN IN D5W 400 MG/200ML IV SOLN
INTRAVENOUS | Status: AC
  Filled 2019-05-30: qty 200

## 2019-05-30 MED ORDER — CIPROFLOXACIN IN D5W 400 MG/200ML IV SOLN
INTRAVENOUS | Status: DC | PRN
  Administered 2019-05-30: 400 mg via INTRAVENOUS

## 2019-05-30 MED ORDER — DEXAMETHASONE SODIUM PHOSPHATE 10 MG/ML IJ SOLN
INTRAMUSCULAR | Status: DC | PRN
  Administered 2019-05-30: 4 mg via INTRAVENOUS

## 2019-05-30 MED ORDER — FENTANYL CITRATE (PF) 100 MCG/2ML IJ SOLN
INTRAMUSCULAR | Status: AC
  Filled 2019-05-30: qty 2

## 2019-05-30 MED ORDER — PROMETHAZINE HCL 25 MG/ML IJ SOLN: 6.2500 mg | INTRAMUSCULAR | Status: DC | PRN

## 2019-05-30 MED ORDER — POTASSIUM CHLORIDE CRYS ER 20 MEQ PO TBCR
40.0000 meq | EXTENDED_RELEASE_TABLET | Freq: Once | ORAL | Status: AC
  Administered 2019-05-30: 40 meq via ORAL
  Filled 2019-05-30: qty 2

## 2019-05-30 MED ORDER — INDOMETHACIN 50 MG RE SUPP
RECTAL | Status: AC
  Filled 2019-05-30: qty 2

## 2019-05-30 MED ORDER — PROPOFOL 10 MG/ML IV BOLUS
INTRAVENOUS | Status: DC | PRN
  Administered 2019-05-30: 200 mg via INTRAVENOUS

## 2019-05-30 MED ORDER — LABETALOL HCL 5 MG/ML IV SOLN
INTRAVENOUS | Status: DC | PRN
  Administered 2019-05-30: 5 mg via INTRAVENOUS

## 2019-05-30 MED ORDER — LIDOCAINE 2% (20 MG/ML) 5 ML SYRINGE
INTRAMUSCULAR | Status: DC | PRN
  Administered 2019-05-30: 100 mg via INTRAVENOUS

## 2019-05-30 MED ORDER — GLUCAGON HCL RDNA (DIAGNOSTIC) 1 MG IJ SOLR
INTRAMUSCULAR | Status: AC
  Filled 2019-05-30: qty 1

## 2019-05-30 NOTE — Anesthesia Procedure Notes (Addendum)
Procedure Name: Intubation Date/Time: 05/30/2019 12:29 PM Performed by: Cecile Hearing, MD Pre-anesthesia Checklist: Patient identified, Emergency Drugs available, Suction available and Patient being monitored Patient Re-evaluated:Patient Re-evaluated prior to induction Oxygen Delivery Method: Circle system utilized Preoxygenation: Pre-oxygenation with 100% oxygen Induction Type: IV induction Ventilation: Two handed mask ventilation required and Mask ventilation without difficulty Laryngoscope Size: Miller and 2 Grade View: Grade II Tube type: Oral Tube size: 7.5 mm Number of attempts: 1 Airway Equipment and Method: Stylet and Oral airway Placement Confirmation: ETT inserted through vocal cords under direct vision,  positive ETCO2 and breath sounds checked- equal and bilateral Secured at: 23 cm Tube secured with: Tape Dental Injury: Teeth and Oropharynx as per pre-operative assessment

## 2019-05-30 NOTE — Transfer of Care (Signed)
Immediate Anesthesia Transfer of Care Note  Patient: Bruce Lindsey  Procedure(s) Performed: ENDOSCOPIC RETROGRADE CHOLANGIOPANCREATOGRAPHY (ERCP) (N/A ) SPHINCTEROTOMY  Patient Location: PACU  Anesthesia Type:General  Level of Consciousness: awake  Airway & Oxygen Therapy: Patient Spontanous Breathing  Post-op Assessment: Report given to RN and Post -op Vital signs reviewed and stable  Post vital signs: Reviewed and stable  Last Vitals:  Vitals Value Taken Time  BP 133/90 05/30/19 1315  Temp 37.4 C 05/30/19 1315  Pulse 89 05/30/19 1315  Resp 16 05/30/19 1315  SpO2 96 % 05/30/19 1315    Last Pain:  Vitals:   05/30/19 1315  TempSrc:   PainSc: 0-No pain         Complications: No apparent anesthesia complications

## 2019-05-30 NOTE — Interval H&P Note (Signed)
History and Physical Interval Note:  05/30/2019 11:53 AM  Bruce Lindsey  has presented today for surgery, with the diagnosis of bile duct stone.  The various methods of treatment have been discussed with the patient and family. After consideration of risks, benefits and other options for treatment, the patient has consented to  Procedure(s): ENDOSCOPIC RETROGRADE CHOLANGIOPANCREATOGRAPHY (ERCP) (N/A) as a surgical intervention.  The patient's history has been reviewed, patient examined, no change in status, stable for surgery.  I have reviewed the patient's chart and labs.  Questions were answered to the patient's satisfaction.     Rachael Fee

## 2019-05-30 NOTE — Op Note (Signed)
[facility] Patient Name: Bruce Lindsey Procedure Date : 05/30/2019 MRN: [id number] Attending MD: Rachael Fee , MD Date of Birth: 09/17/1970 CSN: [id number] Age: 49 Admit Type: Inpatient Procedure:                ERCP Indications:              Gallstone pancreatitis, CT scan shows two residual                            CBD stones, elevated liver tests Providers:                Rachael Fee, MD, Glory Rosebush, RN, Kandice Robinsons, Technician Referring MD:              Medicines:                General Anesthesia, Indomethacin 100 mg PR, cipro                            400mg  IV Complications:            No immediate complications. Estimated blood loss:                            None Estimated Blood Loss:     Estimated blood loss: none. Procedure:                Pre-Anesthesia Assessment:                           - Prior to the procedure, a History and Physical                            was performed, and patient medications and                            allergies were reviewed. The patient's tolerance of                            previous anesthesia was also reviewed. The risks                            and benefits of the procedure and the sedation                            options and risks were discussed with the patient.                            All questions were answered, and informed consent                            was obtained. Prior Anticoagulants: The patient has                            taken no  previous anticoagulant or antiplatelet                            agents. ASA Grade Assessment: II - A patient with                            mild systemic disease. After reviewing the risks                            and benefits, the patient was deemed in                            satisfactory condition to undergo the procedure.                           After obtaining informed consent, the scope was             passed under direct vision. Throughout the                            procedure, the patient's blood pressure, pulse, and                            oxygen saturations were monitored continuously. The                            [id number]-[id number] ([id number]) Olympus Duodensocope was                            introduced through the mouth, and used to inject                            contrast into and used to inject contrast into the                            bile duct. The ERCP was accomplished without                            difficulty. The patient tolerated the procedure                            well. Scope In: Scope Out: Findings:      The scout film was normal. The esophagus was successfully intubated       under direct vision. The scope was advanced to a normal major papilla in       the descending duodenum without detailed examination of the pharynx,       larynx and associated structures, and upper GI tract. The major papilla       was normal. I used a 44 Autotome over a .035 hydrawire to cannulate the       bile duct and then injected contrast. Cholangiogram revealed non-dilated       extrahepatic biliary tree. The cystic duct was patent and the GB       partially filled with dye. Gallstones were evident in the gallbladder.  There were no obvious CBD stones however given the CT findings I elected       to create a small biliary sphincterotomy and then swept the ducts       several times with a 9-58mm biliary balloon. No stones or purelence was       delivered into the duodenum. The main pancreatic duct was briefly       cannulated with the wire but never injected with dye. Impression:               - Normal biliary tree however given the CT findings                            I performed a small biliary sphincterotomy and                            swept the duct several times. No stones were found.                            I suspect the stones passed into the  duodenum since                            the CT scan yesterday.                           - Patent cystic duct.                           - Cholelithiasis. Recommendation:           - Will advance diet.                           - Hospitalist to consult general surgery to                            consider cholecystectomy this admission. Procedure Code(s):        --- Professional ---                           [phone], Endoscopic retrograde                            cholangiopancreatography (ERCP); with                            sphincterotomy/papillotomy Diagnosis Code(s):        --- Professional ---                           K80.50, Calculus of bile duct without cholangitis                            or cholecystitis without obstruction CPT copyright 2019 American Medical Association. All rights reserved. The codes documented in this report are preliminary and upon coder review may  be revised to meet current compliance requirements. Milus Banister, MD 05/30/2019 1:15:19 PM This report has been signed electronically. Number of Addenda: 0

## 2019-05-30 NOTE — Progress Notes (Signed)
Pt returned to 6N22 from PACU after endo procedure. Received report from [place], Charity fundraiser. See reassessment. Will continue to monitor.

## 2019-05-30 NOTE — Anesthesia Postprocedure Evaluation (Signed)
Anesthesia Post Note  Patient: Bruce Lindsey  Procedure(s) Performed: ENDOSCOPIC RETROGRADE CHOLANGIOPANCREATOGRAPHY (ERCP) (N/A ) SPHINCTEROTOMY     Patient location during evaluation: PACU Anesthesia Type: General Level of consciousness: awake and alert Pain management: pain level controlled Vital Signs Assessment: post-procedure vital signs reviewed and stable Respiratory status: spontaneous breathing, nonlabored ventilation, respiratory function stable and patient connected to nasal cannula oxygen Cardiovascular status: blood pressure returned to baseline and stable Postop Assessment: no apparent nausea or vomiting Anesthetic complications: no    Last Vitals:  Vitals:   05/30/19 1330 05/30/19 1406  BP: 129/81 (!) 143/92  Pulse: 79 78  Resp: 12 18  Temp: 37.3 C 37 C  SpO2: 99% 98%    Last Pain:  Vitals:   05/30/19 1406  TempSrc: Oral  PainSc:                  Cecile Hearing

## 2019-05-30 NOTE — Consult Note (Addendum)
Referring Provider:  [facility]         Primary Care Physician:  Patient, No Pcp Per Primary Gastroenterologist:   Althia Forts           We were asked to see this patient for:   Gallstone pancreatitis / cbd stones             ASSESSMENT /  PLAN    49 yo healthy male with no significant PMH   # Gallstone pancreatitis  --LFTs improving but two distal CBD stones without ductal dilatation were seen on CT scan yesterday.   --Overall pancreatitis seems to be mild.  --Patient will need ERCP with stone extraction and possible sphincterotomy.  The benefits as well as risk of the procedure including infection, bleeding, perforation, and worsening pancreatitis were discussed with the patient and he agrees to proceed --Need eventual cholecystectomy   HPI:    Chief Complaint: Abdominal pain  Bruce Lindsey is a 49 y.o. male who presented to [facility] ED 5/20 with abdominal pain, nausea / vomiting.   05/28/19 ED VISIT for abdominal pain , nausea and vomiting.the generalized upper abdominal pain was intermittent, crampy in nature .  Pain did not particularly radiate through to his back. It was not exacerbated by eating though eating did lead to nausea and vomiting .  Patient has never had this type of pain before.  WBC 14.8, hgb 17, lipase 5500, Alk phos 152, Tbili 4.3, AST 154 / ALT 397.Renal function normal.  COVID negative. RUQ remarkable for cholelithiasis, no acute cholecystitis. No biliary duct dilation.  CT scan  abd/ pelvis with contrast remarkable for choleithiasiis and choledocholithiasis (Two distal CBD calculi with no ductal dilatation), the larger measures 7 x 3 mm at the pancreatic head. with suggestion of pancreatitis. Patient transferred here for possible ERCP as ? Dr. Allen Norris wasn't available and [facility] was on diversion.   Today, temp 99,  lipase is 111, LFTs significantly improved.  Patient has not had any pain to speak of since arriving to [facility] ED.   Patient denies any chronic GI  problems.  He denies family history of any GI diseases  PMH: No significant past medical history  PSH: No history of surgeries  [place] : No family history colon cancer  Social history: No significant alcohol use, non-smoker.  Married.  Lives in [place]   Prior to Admission medications   Medication Sig Start Date End Date Taking? Authorizing Provider  acetaminophen (TYLENOL) 500 MG tablet Take 1,000 mg by mouth every 6 (six) hours as needed for mild pain or moderate pain.   Yes [provider]    Current Facility-Administered Medications  Medication Dose Route Frequency Provider Last Rate Last Admin  . dextrose 5 % in lactated ringers infusion   Intravenous Continuous Elwyn Reach, MD 125 mL/hr at 05/30/19 0446 New Bag at 05/30/19 0446  . HYDROmorphone (DILAUDID) injection 0.5-1 mg  0.5-1 mg Intravenous Q2H PRN Gala Romney L, MD      . ondansetron (ZOFRAN) tablet 4 mg  4 mg Oral Q6H PRN Elwyn Reach, MD       Or  . ondansetron (ZOFRAN) injection 4 mg  4 mg Intravenous Q6H PRN Elwyn Reach, MD        Allergies as of 05/29/2019  . (No Known Allergies)     Social History   Socioeconomic History  . Marital status: Married    Spouse name: Not on file  . Number of children: Not  on file  . Years of education: Not on file  . Highest education level: Not on file  Occupational History  . Not on file  Tobacco Use  . Smoking status: Never Smoker  . Smokeless tobacco: Never Used  Substance and Sexual Activity  . Alcohol use: No  . Drug use: Not on file  . Sexual activity: Not on file  Other Topics Concern  . Not on file  Social History Narrative  . Not on file   Social Determinants of Health   Financial Resource Strain:   . Difficulty of Paying Living Expenses:   Food Insecurity:   . Worried About Running Out of Food in the Last Year:   . Ran Out of Food in the Last Year:   Transportation Needs:   . Lack of Transportation (Medical):   . Lack of  Transportation (Non-Medical):   Physical Activity:   . Days of Exercise per Week:   . Minutes of Exercise per Session:   Stress:   . Feeling of Stress :   Social Connections:   . Frequency of Communication with Friends and Family:   . Frequency of Social Gatherings with Friends and Family:   . Attends Religious Services:   . Active Member of Clubs or Organizations:   . Attends Club or Organization Meetings:   . Marital Status:   Intimate Partner Violence:   . Fear of Current or Ex-Partner:   . Emotionally Abused:   . Physically Abused:   . Sexually Abused:     Review of Systems: All systems reviewed and negative except where noted in HPI.  Physical Exam: Vital signs in last 24 hours: Temp:  [98 F (36.7 C)-99.6 F (37.6 C)] 99 F (37.2 C) (05/22 0446) Pulse Rate:  [72-100] 78 (05/22 0446) Resp:  [16-18] 18 (05/22 0446) BP: (134-156)/(83-107) 134/85 (05/22 0446) SpO2:  [95 %-100 %] 96 % (05/22 0446) Weight:  [90.3 kg] 90.3 kg (05/21 1926) Last BM Date: 05/28/19 General:   Alert, well-developed, male in NAD Psych:  Pleasant, cooperative. Normal mood and affect. Eyes:  Pupils equal, sclera clear, no icterus.   Conjunctiva pink. Ears:  Normal auditory acuity. Nose:  No deformity, discharge,  or lesions. Neck:  Supple; no masses Lungs:  Clear throughout to auscultation.   No wheezes, crackles, or rhonchi.  Heart:  Regular rate and rhythm; no murmurs, no lower extremity edema Abdomen:  Soft, non-distended, nontender, BS active, no palp mass   Rectal:  Deferred  Msk:  Symmetrical without gross deformities. . Neurologic:  Alert and  oriented x4;  grossly normal neurologically. Skin:  Intact without significant lesions or rashes.   Intake/Output from previous day: 05/21 0701 - 05/22 0700 In: 1090.2 [I.V.:1090.2] Out: 1 [Urine:1] Intake/Output this shift: No intake/output data recorded.  Lab Results: Recent Labs    05/28/19 2029  WBC 14.8*  HGB 17.2*  HCT 51.0    PLT 232   BMET Recent Labs    05/28/19 2029 05/30/19 0732  NA 139 138  K 3.9 3.4*  CL 102 103  CO2 26 27  GLUCOSE 140* 114*  BUN 9 6  CREATININE 1.09 0.92  CALCIUM 9.1 8.9   LFT Recent Labs    05/30/19 0732  PROT 7.1  ALBUMIN 3.5  AST 77*  ALT 250*  ALKPHOS 135*  BILITOT 1.7*   PT/INR No results for input(s): LABPROT, INR in the last 72 hours. Hepatitis Panel No results for input(s): HEPBSAG, HCVAB, HEPAIGM, HEPBIGM in the   last 72 hours.   . CBC Latest Ref Rng & Units 05/28/2019  WBC 4.0 - 10.5 K/uL 14.8(H)  Hemoglobin 13.0 - 17.0 g/dL 17.2(H)  Hematocrit 39.0 - 52.0 % 51.0  Platelets 150 - 400 K/uL 232    . CMP Latest Ref Rng & Units 05/30/2019 05/28/2019  Glucose 70 - 99 mg/dL 114(H) 140(H)  BUN 6 - 20 mg/dL 6 9  Creatinine 0.61 - 1.24 mg/dL 0.92 1.09  Sodium 135 - 145 mmol/L 138 139  Potassium 3.5 - 5.1 mmol/L 3.4(L) 3.9  Chloride 98 - 111 mmol/L 103 102  CO2 22 - 32 mmol/L 27 26  Calcium 8.9 - 10.3 mg/dL 8.9 9.1  Total Protein 6.5 - 8.1 g/dL 7.1 8.2(H)  Total Bilirubin 0.3 - 1.2 mg/dL 1.7(H) 4.3(H)  Alkaline Phos 38 - 126 U/L 135(H) 152(H)  AST 15 - 41 U/L 77(H) 154(H)  ALT 0 - 44 U/L 250(H) 397(H)   Studies/Results: CT ABDOMEN PELVIS W CONTRAST  Result Date: 05/29/2019 CLINICAL DATA:  Nausea and vomiting with pancreatitis suspected EXAM: CT ABDOMEN AND PELVIS WITH CONTRAST TECHNIQUE: Multidetector CT imaging of the abdomen and pelvis was performed using the standard protocol following bolus administration of intravenous contrast. CONTRAST:  163m OMNIPAQUE IOHEXOL 300 MG/ML  SOLN COMPARISON:  05/29/2019 ultrasound FINDINGS: Lower chest:  No contributory findings. Hepatobiliary: No focal liver abnormality.Cholelithiasis. No evidence of acute cholecystitis. Two distal CBD calculi with no ductal dilatation. The larger measures 7 x 3 mm at the pancreatic head. The smaller is punctate and at the level of the punctum. Pancreas: Edema at the root of the small  bowel mesentery likely related to pancreatitis, although the pancreatic parenchyma does not appear expanded or edematous, there is notably elevated lipase. No collection or necrosis. Spleen: Unremarkable. Adrenals/Urinary Tract: Negative adrenals. No hydronephrosis or stone. Unremarkable bladder. Stomach/Bowel: No obstruction. No appendicitis. Distal colonic diverticulosis. Vascular/Lymphatic: No acute vascular abnormality. Multifocal atherosclerotic calcification. No mass or adenopathy. Reproductive:No pathologic findings. Other: No ascites or pneumoperitoneum. Musculoskeletal: No acute abnormalities. IMPRESSION: Cholelithiasis and choledocholithiasis with edema in the subperitoneal fat presumably related to the known pancreatitis. Electronically Signed   By: JMonte FantasiaM.D.   On: 05/29/2019 04:38   [place] LIMITED RUQ  Result Date: 05/29/2019 CLINICAL DATA:  Initial evaluation for acute abdominal pain, nausea, vomiting, elevated LFTs, pancreatitis. EXAM: ULTRASOUND ABDOMEN LIMITED RIGHT UPPER QUADRANT COMPARISON:  None. FINDINGS: Gallbladder: Multiple echogenic stones seen within the gallbladder lumen, largest of which measures 7 mm. Gallbladder wall measures within normal limits at 2.1 mm. No free pericholecystic fluid. No sonographic Murphy sign elicited on exam. Common bile duct: Diameter: 3.7 mm Liver: No focal lesion identified. Within normal limits in parenchymal echogenicity. Portal vein is patent on color Doppler imaging with normal direction of blood flow towards the liver. Other: None. IMPRESSION: 1. Cholelithiasis. No sonographic features to suggest acute cholecystitis. 2. No biliary dilatation. Electronically Signed   By: BJeannine BogaM.D.   On: 05/29/2019 03:26     PUndray Allman NP-C @  05/30/2019, 9:22 AM    ________________________________________________________________________  LVelora HecklerGI MD note:  I personally examined the patient, reviewed the data and agree with  the assessment and plan described above.  Gallstone pancreatitis, improving quickly and CT scan showing 2 bile duct stones.  I am planning ERCP today.   DOwens Loffler MD [facility] Pager [phone]

## 2019-05-30 NOTE — H&P (View-Only) (Signed)
Referring Provider:  [facility]         Primary Care Physician:  Patient, No Pcp Per Primary Gastroenterologist:   Althia Forts           We were asked to see this patient for:   Gallstone pancreatitis / cbd stones             ASSESSMENT /  PLAN    49 yo healthy male with no significant PMH   # Gallstone pancreatitis  --LFTs improving but two distal CBD stones without ductal dilatation were seen on CT scan yesterday.   --Overall pancreatitis seems to be mild.  --Patient will need ERCP with stone extraction and possible sphincterotomy.  The benefits as well as risk of the procedure including infection, bleeding, perforation, and worsening pancreatitis were discussed with the patient and he agrees to proceed --Need eventual cholecystectomy   HPI:    Chief Complaint: Abdominal pain  Bruce Lindsey is a 49 y.o. male who presented to [facility] ED 5/20 with abdominal pain, nausea / vomiting.   05/28/19 ED VISIT for abdominal pain , nausea and vomiting.the generalized upper abdominal pain was intermittent, crampy in nature .  Pain did not particularly radiate through to his back. It was not exacerbated by eating though eating did lead to nausea and vomiting .  Patient has never had this type of pain before.  WBC 14.8, hgb 17, lipase 5500, Alk phos 152, Tbili 4.3, AST 154 / ALT 397.Renal function normal.  COVID negative. RUQ remarkable for cholelithiasis, no acute cholecystitis. No biliary duct dilation.  CT scan  abd/ pelvis with contrast remarkable for choleithiasiis and choledocholithiasis (Two distal CBD calculi with no ductal dilatation), the larger measures 7 x 3 mm at the pancreatic head. with suggestion of pancreatitis. Patient transferred here for possible ERCP as ? Dr. Allen Norris wasn't available and [facility] was on diversion.   Today, temp 99,  lipase is 111, LFTs significantly improved.  Patient has not had any pain to speak of since arriving to [facility] ED.   Patient denies any chronic GI  problems.  He denies family history of any GI diseases  PMH: No significant past medical history  PSH: No history of surgeries  [place] : No family history colon cancer  Social history: No significant alcohol use, non-smoker.  Married.  Lives in [place]   Prior to Admission medications   Medication Sig Start Date End Date Taking? Authorizing Provider  acetaminophen (TYLENOL) 500 MG tablet Take 1,000 mg by mouth every 6 (six) hours as needed for mild pain or moderate pain.   Yes [provider]    Current Facility-Administered Medications  Medication Dose Route Frequency Provider Last Rate Last Admin  . dextrose 5 % in lactated ringers infusion   Intravenous Continuous Elwyn Reach, MD 125 mL/hr at 05/30/19 0446 New Bag at 05/30/19 0446  . HYDROmorphone (DILAUDID) injection 0.5-1 mg  0.5-1 mg Intravenous Q2H PRN Gala Romney L, MD      . ondansetron (ZOFRAN) tablet 4 mg  4 mg Oral Q6H PRN Elwyn Reach, MD       Or  . ondansetron (ZOFRAN) injection 4 mg  4 mg Intravenous Q6H PRN Elwyn Reach, MD        Allergies as of 05/29/2019  . (No Known Allergies)     Social History   Socioeconomic History  . Marital status: Married    Spouse name: Not on file  . Number of children: Not  on file  . Years of education: Not on file  . Highest education level: Not on file  Occupational History  . Not on file  Tobacco Use  . Smoking status: Never Smoker  . Smokeless tobacco: Never Used  Substance and Sexual Activity  . Alcohol use: No  . Drug use: Not on file  . Sexual activity: Not on file  Other Topics Concern  . Not on file  Social History Narrative  . Not on file   Social Determinants of Health   Financial Resource Strain:   . Difficulty of Paying Living Expenses:   Food Insecurity:   . Worried About Charity fundraiser in the Last Year:   . Arboriculturist in the Last Year:   Transportation Needs:   . Film/video editor (Medical):   Marland Kitchen Lack of  Transportation (Non-Medical):   Physical Activity:   . Days of Exercise per Week:   . Minutes of Exercise per Session:   Stress:   . Feeling of Stress :   Social Connections:   . Frequency of Communication with Friends and Family:   . Frequency of Social Gatherings with Friends and Family:   . Attends Religious Services:   . Active Member of Clubs or Organizations:   . Attends Archivist Meetings:   Marland Kitchen Marital Status:   Intimate Partner Violence:   . Fear of Current or Ex-Partner:   . Emotionally Abused:   Marland Kitchen Physically Abused:   . Sexually Abused:     Review of Systems: All systems reviewed and negative except where noted in HPI.  Physical Exam: Vital signs in last 24 hours: Temp:  [98 F (36.7 C)-99.6 F (37.6 C)] 99 F (37.2 C) (05/22 0446) Pulse Rate:  [72-100] 78 (05/22 0446) Resp:  [16-18] 18 (05/22 0446) BP: (134-156)/(83-107) 134/85 (05/22 0446) SpO2:  [95 %-100 %] 96 % (05/22 0446) Weight:  [90.3 kg] 90.3 kg (05/21 1926) Last BM Date: 05/28/19 General:   Alert, well-developed, male in NAD Psych:  Pleasant, cooperative. Normal mood and affect. Eyes:  Pupils equal, sclera clear, no icterus.   Conjunctiva pink. Ears:  Normal auditory acuity. Nose:  No deformity, discharge,  or lesions. Neck:  Supple; no masses Lungs:  Clear throughout to auscultation.   No wheezes, crackles, or rhonchi.  Heart:  Regular rate and rhythm; no murmurs, no lower extremity edema Abdomen:  Soft, non-distended, nontender, BS active, no palp mass   Rectal:  Deferred  Msk:  Symmetrical without gross deformities. . Neurologic:  Alert and  oriented x4;  grossly normal neurologically. Skin:  Intact without significant lesions or rashes.   Intake/Output from previous day: 05/21 0701 - 05/22 0700 In: 1090.2 [I.V.:1090.2] Out: 1 [Urine:1] Intake/Output this shift: No intake/output data recorded.  Lab Results: Recent Labs    05/28/19 2029  WBC 14.8*  HGB 17.2*  HCT 51.0    PLT 232   BMET Recent Labs    05/28/19 2029 05/30/19 0732  NA 139 138  K 3.9 3.4*  CL 102 103  CO2 26 27  GLUCOSE 140* 114*  BUN 9 6  CREATININE 1.09 0.92  CALCIUM 9.1 8.9   LFT Recent Labs    05/30/19 0732  PROT 7.1  ALBUMIN 3.5  AST 77*  ALT 250*  ALKPHOS 135*  BILITOT 1.7*   PT/INR No results for input(s): LABPROT, INR in the last 72 hours. Hepatitis Panel No results for input(s): HEPBSAG, HCVAB, [place], HEPBIGM in the  last 72 hours.   . CBC Latest Ref Rng & Units 05/28/2019  WBC 4.0 - 10.5 K/uL 14.8(H)  Hemoglobin 13.0 - 17.0 g/dL 17.2(H)  Hematocrit 39.0 - 52.0 % 51.0  Platelets 150 - 400 K/uL 232    . CMP Latest Ref Rng & Units 05/30/2019 05/28/2019  Glucose 70 - 99 mg/dL 114(H) 140(H)  BUN 6 - 20 mg/dL 6 9  Creatinine 0.61 - 1.24 mg/dL 0.92 1.09  Sodium 135 - 145 mmol/L 138 139  Potassium 3.5 - 5.1 mmol/L 3.4(L) 3.9  Chloride 98 - 111 mmol/L 103 102  CO2 22 - 32 mmol/L 27 26  Calcium 8.9 - 10.3 mg/dL 8.9 9.1  Total Protein 6.5 - 8.1 g/dL 7.1 8.2(H)  Total Bilirubin 0.3 - 1.2 mg/dL 1.7(H) 4.3(H)  Alkaline Phos 38 - 126 U/L 135(H) 152(H)  AST 15 - 41 U/L 77(H) 154(H)  ALT 0 - 44 U/L 250(H) 397(H)   Studies/Results: CT ABDOMEN PELVIS W CONTRAST  Result Date: 05/29/2019 CLINICAL DATA:  Nausea and vomiting with pancreatitis suspected EXAM: CT ABDOMEN AND PELVIS WITH CONTRAST TECHNIQUE: Multidetector CT imaging of the abdomen and pelvis was performed using the standard protocol following bolus administration of intravenous contrast. CONTRAST:  163m OMNIPAQUE IOHEXOL 300 MG/ML  SOLN COMPARISON:  05/29/2019 ultrasound FINDINGS: Lower chest:  No contributory findings. Hepatobiliary: No focal liver abnormality.Cholelithiasis. No evidence of acute cholecystitis. Two distal CBD calculi with no ductal dilatation. The larger measures 7 x 3 mm at the pancreatic head. The smaller is punctate and at the level of the punctum. Pancreas: Edema at the root of the small  bowel mesentery likely related to pancreatitis, although the pancreatic parenchyma does not appear expanded or edematous, there is notably elevated lipase. No collection or necrosis. Spleen: Unremarkable. Adrenals/Urinary Tract: Negative adrenals. No hydronephrosis or stone. Unremarkable bladder. Stomach/Bowel: No obstruction. No appendicitis. Distal colonic diverticulosis. Vascular/Lymphatic: No acute vascular abnormality. Multifocal atherosclerotic calcification. No mass or adenopathy. Reproductive:No pathologic findings. Other: No ascites or pneumoperitoneum. Musculoskeletal: No acute abnormalities. IMPRESSION: Cholelithiasis and choledocholithiasis with edema in the subperitoneal fat presumably related to the known pancreatitis. Electronically Signed   By: JMonte FantasiaM.D.   On: 05/29/2019 04:38   [place] LIMITED RUQ  Result Date: 05/29/2019 CLINICAL DATA:  Initial evaluation for acute abdominal pain, nausea, vomiting, elevated LFTs, pancreatitis. EXAM: ULTRASOUND ABDOMEN LIMITED RIGHT UPPER QUADRANT COMPARISON:  None. FINDINGS: Gallbladder: Multiple echogenic stones seen within the gallbladder lumen, largest of which measures 7 mm. Gallbladder wall measures within normal limits at 2.1 mm. No free pericholecystic fluid. No sonographic Murphy sign elicited on exam. Common bile duct: Diameter: 3.7 mm Liver: No focal lesion identified. Within normal limits in parenchymal echogenicity. Portal vein is patent on color Doppler imaging with normal direction of blood flow towards the liver. Other: None. IMPRESSION: 1. Cholelithiasis. No sonographic features to suggest acute cholecystitis. 2. No biliary dilatation. Electronically Signed   By: BJeannine BogaM.D.   On: 05/29/2019 03:26     PUndray Allman NP-C @  05/30/2019, 9:22 AM    ________________________________________________________________________  LVelora HecklerGI MD note:  I personally examined the patient, reviewed the data and agree with  the assessment and plan described above.  Gallstone pancreatitis, improving quickly and CT scan showing 2 bile duct stones.  I am planning ERCP today.   DOwens Loffler MD [facility] Pager [phone]

## 2019-05-30 NOTE — Anesthesia Preprocedure Evaluation (Addendum)
Anesthesia Evaluation  Patient identified by MRN, date of birth, ID band Patient awake    Reviewed: Allergy & Precautions, NPO status , Patient's Chart, lab work & pertinent test results  Airway Mallampati: II  TM Distance: >3 FB Neck ROM: Full    Dental  (+) Dental Advisory Given, Poor Dentition, Chipped, Missing   Pulmonary neg pulmonary ROS,    Pulmonary exam normal breath sounds clear to auscultation       Cardiovascular negative cardio ROS Normal cardiovascular exam Rhythm:Regular Rate:Normal     Neuro/Psych negative neurological ROS     GI/Hepatic negative GI ROS, Acute gallstone pancreatitis   Endo/Other  negative endocrine ROS  Renal/GU negative Renal ROS     Musculoskeletal negative musculoskeletal ROS (+)   Abdominal   Peds  Hematology negative hematology ROS (+)   Anesthesia Other Findings Day of surgery medications reviewed with the patient.  Reproductive/Obstetrics                           Anesthesia Physical Anesthesia Plan  ASA: II  Anesthesia Plan: General   Post-op Pain Management:    Induction: Intravenous  PONV Risk Score and Plan: 2 and Dexamethasone, Ondansetron and Midazolam  Airway Management Planned: Oral ETT  Additional Equipment:   Intra-op Plan:   Post-operative Plan: Extubation in OR  Informed Consent: I have reviewed the patients History and Physical, chart, labs and discussed the procedure including the risks, benefits and alternatives for the proposed anesthesia with the patient or authorized representative who has indicated his/her understanding and acceptance.     Dental advisory given  Plan Discussed with: CRNA  Anesthesia Plan Comments:         Anesthesia Quick Evaluation

## 2019-05-30 NOTE — Progress Notes (Signed)
Bruce NOTE    Bruce Lindsey  Bruce Lindsey:[id number] DOB: 19-May-1970 DOA: 05/29/2019 PCP: Lindsey, Bruce Lindsey    Brief Narrative:  49 y.o. male with medical history significant of Bruce significant past medical history who presented to [facility] with abdominal pain nausea and vomiting for 2 days.  Pain is located in the epigastric region.  Sharp.  Rated as 9 out of 10.  Not relieved by anything except for pain medication in the ER.  Associated with bloating.  Symptoms are intermittent.  Denied any diarrhea.  Denied any melena or bright red blood Lindsey rectum.  Bruce hematemesis.  Lindsey was seen and evaluated.  Lindsey denies alcohol or tobacco use.  Lipid panel is currently unknown.  Findings: Found acute pancreatitis.  Also evidence of gallstones.  Lipase more than 5000.  Lindsey is requiring ERCP which is not available over there so Lindsey transferred to this hospital.  Dr. Leone Payor to see Lindsey.  At this point is complaining of pain otherwise hemodynamically stable..  ED Course: Temperature 98.9 blood pressure 140/88 pulse 80.  Respiratory rate of 16 oxygen sat 95% on room air.  Urinalysis is negative.  Chemistry showed normal electrolytes, glucose is 140.  Alkaline phosphatase 152.  Albumin 4.41 lipase 5577.  AST 154 and ALT 397.  Total bilirubin is 8.2.  White count is 14,000.  CT abdomen pelvis showed cholelithiasis and choledocholithiasis with edema in the subperitoneal fat consistent with pancreatitis. Right upper quadrant abdominal ultrasound showed cholelithiasis.  Bruce acute cholecystitis and Bruce biliary dilatation.  Lindsey therefore being admitted with acute gallstone pancreatitis.  Assessment & Plan:   Principal Problem:   Acute gallstone pancreatitis Active Problems:   Leucocytosis   Pancreatitis, gallstone   Bile duct stone   #1 acute gallstone pancreatitis:  -Presenting lipase of 5577, now much improved to 111 -Continued NPO status with IVF hydration -GI consulted, for ERCP  today -Abd [place] reviewed. Findings notable for gallstones.   #2 leukocytosis:  -Likely secondary to acute pancreatitis.   -WBC trending down -Repeat CBC in AM  #3Hypokalemia -Will replace -Repeat lytes in AM  #4Elevated LFT's -Likely related to above gallstone pancreatitis -LFT trends are improving -Recheck lft in AM  DVT prophylaxis: SCD's Code Status: Full Family Communication: Pt in room, family not at bedside  Status is: Inpatient  Remains inpatient appropriate because:Ongoing diagnostic testing needed not appropriate for outpatient work up   Dispo: The Lindsey is from: Home              Anticipated d/c is to: Home              Anticipated d/c date is: 2 days              Lindsey currently is not medically stable to d/c.        Consultants:   GI  Procedures:   ERCP 5/22  Antimicrobials: Anti-infectives (From admission, onward)   None       Subjective: Seen prior to procedure. Reports feeling hungry, eager for procedure  Objective: Vitals:   05/30/19 1208 05/30/19 1315 05/30/19 1330 05/30/19 1406  BP: (!) 172/96 133/90 129/81 (!) 143/92  Pulse: 91 89 79 78  Resp: (!) 21 16 12 18   Temp: 99.2 F (37.3 C) 99.4 F (37.4 C) 99.2 F (37.3 C) 98.6 F (37 C)  TempSrc: Axillary   Oral  SpO2: 98% 96% 99% 98%  Weight: 90.3 kg     Height:  Intake/Output Summary (Last 24 hours) at 05/30/2019 1539 Last data filed at 05/30/2019 1318 Gross Lindsey 24 hour  Intake 1990.18 ml  Output 1 ml  Net 1989.18 ml   Filed Weights   05/29/19 1926 05/30/19 1208  Weight: 90.3 kg 90.3 kg    Examination:  General exam: Appears calm and comfortable  Respiratory system: Clear to auscultation. Respiratory effort normal. Cardiovascular system: S1 & S2 heard, Regular Gastrointestinal system: Abdomen is nondistended, soft  Generally tender Central nervous system: Alert and oriented. Bruce focal neurological deficits. Extremities: Symmetric 5 x 5 power. Skin: Bruce  rashes, lesions Psychiatry: Judgement and insight appear normal. Mood & affect appropriate.   Data Reviewed: I have personally reviewed following labs and imaging studies  CBC: Recent Labs  Lab 05/28/19 2029 05/30/19 0732  WBC 14.8* 11.2*  HGB 17.2* 15.8  HCT 51.0 48.1  MCV 87.8 90.8  PLT 232 093   Basic Metabolic Panel: Recent Labs  Lab 05/28/19 2029 05/30/19 0732  NA 139 138  K 3.9 3.4*  CL 102 103  CO2 26 27  GLUCOSE 140* 114*  BUN 9 6  CREATININE 1.09 0.92  CALCIUM 9.1 8.9   GFR: Estimated Creatinine Clearance: 107.8 mL/min (by C-G formula based on SCr of 0.92 mg/dL). Liver Function Tests: Recent Labs  Lab 05/28/19 2029 05/30/19 0732  AST 154* 77*  ALT 397* 250*  ALKPHOS 152* 135*  BILITOT 4.3* 1.7*  PROT 8.2* 7.1  ALBUMIN 4.4 3.5   Recent Labs  Lab 05/28/19 2029 05/30/19 0732  LIPASE 5,577* 111*   Bruce results for input(s): AMMONIA in the last 168 hours. Coagulation Profile: Bruce results for input(s): INR, PROTIME in the last 168 hours. Cardiac Enzymes: Bruce results for input(s): CKTOTAL, CKMB, CKMBINDEX, TROPONINI in the last 168 hours. BNP (last 3 results) Bruce results for input(s): PROBNP in the last 8760 hours. HbA1C: Bruce results for input(s): HGBA1C in the last 72 hours. CBG: Bruce results for input(s): GLUCAP in the last 168 hours. Lipid Profile: Bruce results for input(s): CHOL, HDL, LDLCALC, TRIG, CHOLHDL, LDLDIRECT in the last 72 hours. Thyroid Function Tests: Bruce results for input(s): TSH, T4TOTAL, FREET4, T3FREE, THYROIDAB in the last 72 hours. Anemia Panel: Bruce results for input(s): VITAMINB12, FOLATE, FERRITIN, TIBC, IRON, RETICCTPCT in the last 72 hours. Sepsis Labs: Bruce results for input(s): PROCALCITON, LATICACIDVEN in the last 168 hours.  Recent Results (from the past 240 hour(s))  SARS Coronavirus 2 by RT PCR (hospital order, performed in [facility] hospital lab) Nasopharyngeal Nasopharyngeal Swab     Status: None   Collection Time:  05/29/19  5:59 AM   Specimen: Nasopharyngeal Swab  Result Value Ref Range Status   SARS Coronavirus 2 NEGATIVE NEGATIVE Final    Comment: (NOTE) SARS-CoV-2 target nucleic acids are NOT DETECTED. The SARS-CoV-2 RNA is generally detectable in upper and lower respiratory specimens during the acute phase of infection. The lowest concentration of SARS-CoV-2 viral copies this assay can detect is 250 copies / mL. A negative result does not preclude SARS-CoV-2 infection and should not be used as the sole basis for treatment or other Lindsey management decisions.  A negative result may occur with improper specimen collection / handling, submission of specimen other than nasopharyngeal swab, presence of viral mutation(s) within the areas targeted by this assay, and inadequate number of viral copies (<250 copies / mL). A negative result must be combined with clinical observations, Lindsey history, and epidemiological information. Fact Sheet for Patients:   StrictlyIdeas.Bruce Fact Sheet  for Healthcare Providers: https://pope.com/ This test is not yet approved or cleared  by the [place] and has been authorized for detection and/or diagnosis of SARS-CoV-2 by FDA under an Emergency Use Authorization (EUA).  This EUA will remain in effect (meaning this test can be used) for the duration of the COVID-19 declaration under Section 564(b)(1) of the Act, 21 U.S.C. section 360bbb-3(b)(1), unless the authorization is terminated or revoked sooner. Performed at [facility], [address], [place], [place] [address]      Radiology Studies: CT ABDOMEN PELVIS W CONTRAST  Result Date: 05/29/2019 CLINICAL DATA:  Nausea and vomiting with pancreatitis suspected EXAM: CT ABDOMEN AND PELVIS WITH CONTRAST TECHNIQUE: Multidetector CT imaging of the abdomen and pelvis was performed using the standard protocol following bolus administration of  intravenous contrast. CONTRAST:  OMNIPAQUE IOHEXOL 300 MG/ML  SOLN COMPARISON:  05/29/2019 ultrasound FINDINGS: Lower chest:  Bruce contributory findings. Hepatobiliary: Bruce focal liver abnormality.Cholelithiasis. Bruce evidence of acute cholecystitis. Two distal CBD calculi with Bruce ductal dilatation. The larger measures 7 x 3 mm at the pancreatic head. The smaller is punctate and at the level of the punctum. Pancreas: Edema at the root of the small bowel mesentery likely related to pancreatitis, although the pancreatic parenchyma does not appear expanded or edematous, there is notably elevated lipase. Bruce collection or necrosis. Spleen: Unremarkable. Adrenals/Urinary Tract: Negative adrenals. Bruce hydronephrosis or stone. Unremarkable bladder. Stomach/Bowel: Bruce obstruction. Bruce appendicitis. Distal colonic diverticulosis. Vascular/Lymphatic: Bruce acute vascular abnormality. Multifocal atherosclerotic calcification. Bruce mass or adenopathy. Reproductive:Bruce pathologic findings. Other: Bruce ascites or pneumoperitoneum. Musculoskeletal: Bruce acute abnormalities. IMPRESSION: Cholelithiasis and choledocholithiasis with edema in the subperitoneal fat presumably related to the known pancreatitis. Electronically Signed   By: Marnee Spring M.D.   On: 05/29/2019 04:38   DG ERCP BILIARY & PANCREATIC DUCTS  Result Date: 05/30/2019 CLINICAL DATA:  49 year old male with a history of choledocholithiasis. EXAM: ERCP TECHNIQUE: Multiple spot images obtained with the fluoroscopic device and submitted for interpretation post-procedure. FLUOROSCOPY TIME:  Fluoroscopy Time:  2 minutes COMPARISON:  CT 05/29/2019 FINDINGS: Limited images during ERCP. Initial image demonstrates endoscope projecting over the upper abdomen. Safety wire in place within the extrahepatic biliary ducts. There is subsequently retrograde infusion of contrast with partial opacification of the extrahepatic biliary ducts. Bruce significant dilation. IMPRESSION: Limited images  during ERCP demonstrates partial opacification of the extrahepatic biliary ducts, not significantly dilated. Please refer to the dictated operative report for full details of intraoperative findings and procedure. Electronically Signed   By: Gilmer Mor D.O.   On: 05/30/2019 14:33   US ABDOMEN LIMITED RUQ  Result Date: 05/29/2019 CLINICAL DATA:  Initial evaluation for acute abdominal pain, nausea, vomiting, elevated LFTs, pancreatitis. EXAM: ULTRASOUND ABDOMEN LIMITED RIGHT UPPER QUADRANT COMPARISON:  None. FINDINGS: Gallbladder: Multiple echogenic stones seen within the gallbladder lumen, largest of which measures 7 mm. Gallbladder wall measures within normal limits at 2.1 mm. Bruce free pericholecystic fluid. Bruce sonographic Murphy sign elicited on exam. Common bile duct: Diameter: 3.7 mm Liver: Bruce focal lesion identified. Within normal limits in parenchymal echogenicity. Portal vein is patent on color Doppler imaging with normal direction of blood flow towards the liver. Other: None. IMPRESSION: 1. Cholelithiasis. Bruce sonographic features to suggest acute cholecystitis. 2. Bruce biliary dilatation. Electronically Signed   By: Rise Mu M.D.   On: 05/29/2019 03:26    Scheduled Meds: Continuous Infusions: . dextrose 5% lactated ringers 125 mL/hr at 05/30/19 1530     LOS:  1 day   Rickey Barbara, MD [facility] Pager On Amion  If 7PM-7AM, please contact night-coverage 05/30/2019, 3:39 PM

## 2019-05-30 NOTE — Plan of Care (Signed)
  Problem: Education: Goal: Knowledge of General Education information will improve Description: Including pain rating scale, medication(s)/side effects and non-pharmacologic comfort measures Outcome: Progressing   Problem: Health Behavior/Discharge Planning: Goal: Ability to manage health-related needs will improve Outcome: Progressing   Problem: Clinical Measurements: Goal: Ability to maintain clinical measurements within normal limits will improve Outcome: Progressing Goal: Diagnostic test results will improve Outcome: Progressing Goal: Respiratory complications will improve Outcome: Progressing   Problem: Activity: Goal: Risk for activity intolerance will decrease Outcome: Progressing   Problem: Pain Managment: Goal: General experience of comfort will improve Outcome: Progressing   Problem: Safety: Goal: Ability to remain free from injury will improve Outcome: Progressing   Problem: Skin Integrity: Goal: Risk for impaired skin integrity will decrease Outcome: Progressing   

## 2019-05-31 LAB — COMPREHENSIVE METABOLIC PANEL
ALT: 188 U/L — ABNORMAL HIGH (ref 0–44)
AST: 53 U/L — ABNORMAL HIGH (ref 15–41)
Albumin: 2.9 g/dL — ABNORMAL LOW (ref 3.5–5.0)
Alkaline Phosphatase: 99 U/L (ref 38–126)
Anion gap: 6 (ref 5–15)
BUN: <5 mg/dL — ABNORMAL LOW (ref 6–20)
CO2: 27 mmol/L (ref 22–32)
Calcium: 8.8 mg/dL — ABNORMAL LOW (ref 8.9–10.3)
Chloride: 107 mmol/L (ref 98–111)
Creatinine, Ser: 0.96 mg/dL (ref 0.61–1.24)
GFR calc Af Amer: >60 mL/min (ref >60)
GFR calc non Af Amer: >60 mL/min (ref >60)
Glucose, Bld: 117 mg/dL — ABNORMAL HIGH (ref 70–99)
Potassium: 3.8 mmol/L (ref 3.5–5.1)
Sodium: 140 mmol/L (ref 135–145)
Total Bilirubin: 1.4 mg/dL — ABNORMAL HIGH (ref 0.3–1.2)
Total Protein: 6.2 g/dL — ABNORMAL LOW (ref 6.5–8.1)

## 2019-05-31 NOTE — Consult Note (Signed)
[facility] Surgery Consult Note  Bruce Lindsey 1970-07-29  [id number].    Requesting MD: Theadore Nan, MD Chief Complaint/Reason for Consult: gallstone pancreatitis, lap chole   HPI: 49 y/o male with no significant PMH who presented to [facility] with a cc N/V and sharp, non-radiating, epigastric abdominal pain. Workup included at CT scan that was significant for cholelithiasis and choledocholithiasis and he was transferred to [facility] for further care. Lipase 5,577 on admission. He underwent ERCP with sphincterotomy yesterday where no stones were visualized in the common bile duct and the cystic duct appeared patent. General surgery was consulted for cholecystectomy.   I saw the patient today and he denies abdominal pain, nausea, or vomiting currently. He denies tobacco or alcohol use. States he has been unable to work normally for the last year due to covid and starts a job Haematologist a Psychologist, prison and probation services.   ROS: Review of Systems  Constitutional: Negative.   HENT: Negative.   Eyes: Negative.   Respiratory: Negative.   Cardiovascular: Negative.   Gastrointestinal: Negative.   Genitourinary: Negative.   Musculoskeletal: Negative.   Skin: Negative.   Neurological: Negative.   Endo/Heme/Allergies: Negative.   Psychiatric/Behavioral: Negative.   All other systems reviewed and are negative.   No family history on file.  No past medical history on file.  No past surgical history on file.  Social History:  reports that he has never smoked. He has never used smokeless tobacco. He reports that he does not drink alcohol. No history on file for drug.  Allergies: No Known Allergies  Medications Prior to Admission  Medication Sig Dispense Refill  . acetaminophen (TYLENOL) 500 MG tablet Take 1,000 mg by mouth every 6 (six) hours as needed for mild pain or moderate pain.      Blood pressure 117/76, pulse 77, temperature 98.4 F (36.9 C), temperature source Oral, resp. rate 18, height 5\' 9"   (1.753 m), weight 90.3 kg, SpO2 100 %. Physical Exam: Constitutional: NAD; conversant; no deformities Eyes: Moist conjunctiva; no lid lag; anicteric; PERRL Neck: Trachea midline; no thyromegaly Lungs: Normal respiratory effort; no tactile fremitus CV: RRR; no palpable thrills; no pitting edema GI: Abd soft; nontender, +BS, no palpable hepatosplenomegaly MSK: Normal gait; no clubbing/cyanosis Psychiatric: Appropriate affect; alert and oriented x3 Lymphatic: No palpable cervical or axillary lymphadenopathy  Results for orders placed or performed during the hospital encounter of 05/29/19 (from the past 48 hour(s))  HIV Antibody (routine testing w rflx)     Status: None   Collection Time: 05/30/19  7:32 AM  Result Value Ref Range   HIV Screen 4th Generation wRfx Non Reactive Non Reactive    Comment: Performed at [facility], [address] [place], [place]  Comprehensive metabolic panel     Status: Abnormal   Collection Time: 05/30/19  7:32 AM  Result Value Ref Range   Sodium 138 135 - 145 mmol/L   Potassium 3.4 (L) 3.5 - 5.1 mmol/L   Chloride 103 98 - 111 mmol/L   CO2 27 22 - 32 mmol/L   Glucose, Bld 114 (H) 70 - 99 mg/dL    Comment: Glucose reference range applies only to samples taken after fasting for at least 8 hours.   BUN 6 6 - 20 mg/dL   Creatinine, Ser 06/01/19 0.61 - 1.24 mg/dL   Calcium 8.9 8.9 - 7.35 mg/dL   Total Protein 7.1 6.5 - 8.1 g/dL   Albumin 3.5 3.5 - 5.0 g/dL   AST  77 (H) 15 - 41 U/L   ALT 250 (H) 0 - 44 U/L   Alkaline Phosphatase 135 (H) 38 - 126 U/L   Total Bilirubin 1.7 (H) 0.3 - 1.2 mg/dL   GFR calc non Af Amer >60 >60 mL/min   GFR calc Af Amer >60 >60 mL/min   Anion gap 8 5 - 15    Comment: Performed at [facility], [address], [place], [place] [address]  CBC     Status: Abnormal   Collection Time: 05/30/19  7:32 AM  Result Value Ref Range   WBC 11.2 (H) 4.0 - 10.5 K/uL   RBC 5.30 4.22 - 5.81 MIL/uL   Hemoglobin 15.8 13.0 -  17.0 g/dL   HCT 53.7 48.2 - 70.7 %   MCV 90.8 80.0 - 100.0 fL   MCH 29.8 26.0 - 34.0 pg   MCHC 32.8 30.0 - 36.0 g/dL   RDW 86.7 54.4 - 92.0 %   Platelets 194 150 - 400 K/uL   nRBC 0.0 0.0 - 0.2 %    Comment: Performed at [facility], [address], [place], [place] [address]  Lipase, blood     Status: Abnormal   Collection Time: 05/30/19  7:32 AM  Result Value Ref Range   Lipase 111 (H) 11 - 51 U/L    Comment: Performed at [facility], [address], [place], [place] [address]  Comprehensive metabolic panel     Status: Abnormal   Collection Time: 05/31/19  7:54 AM  Result Value Ref Range   Sodium 140 135 - 145 mmol/L   Potassium 3.8 3.5 - 5.1 mmol/L   Chloride 107 98 - 111 mmol/L   CO2 27 22 - 32 mmol/L   Glucose, Bld 117 (H) 70 - 99 mg/dL    Comment: Glucose reference range applies only to samples taken after fasting for at least 8 hours.   BUN <5 (L) 6 - 20 mg/dL   Creatinine, Ser 8.83 0.61 - 1.24 mg/dL   Calcium 8.8 (L) 8.9 - 10.3 mg/dL   Total Protein 6.2 (L) 6.5 - 8.1 g/dL   Albumin 2.9 (L) 3.5 - 5.0 g/dL   AST 53 (H) 15 - 41 U/L   ALT 188 (H) 0 - 44 U/L   Alkaline Phosphatase 99 38 - 126 U/L   Total Bilirubin 1.4 (H) 0.3 - 1.2 mg/dL   GFR calc non Af Amer >60 >60 mL/min   GFR calc Af Amer >60 >60 mL/min   Anion gap 6 5 - 15    Comment: Performed at [facility], [address], [place], [place] [address]   DG ERCP BILIARY & PANCREATIC DUCTS  Result Date: 05/30/2019 CLINICAL DATA:  49 year old male with a history of choledocholithiasis. EXAM: ERCP TECHNIQUE: Multiple spot images obtained with the fluoroscopic device and submitted for interpretation post-procedure. FLUOROSCOPY TIME:  Fluoroscopy Time:  2 minutes COMPARISON:  CT 05/29/2019 FINDINGS: Limited images during ERCP. Initial image demonstrates endoscope projecting over the upper abdomen. Safety wire in place within the extrahepatic biliary ducts. There is subsequently retrograde infusion of  contrast with partial opacification of the extrahepatic biliary ducts. No significant dilation. IMPRESSION: Limited images during ERCP demonstrates partial opacification of the extrahepatic biliary ducts, not significantly dilated. Please refer to the dictated operative report for full details of intraoperative findings and procedure. Electronically Signed   By: Gilmer Mor D.O.   On: 05/30/2019 14:33   Assessment/Plan Gallstone pancreatitis  49 y/o otherwise healthy male admitted with gallstone pancreatitis. No signs of cholecystitis. nontender on abdominal exam, LFTs and bilirubin trending down. Lipase 111 from over 5,000. I recommended laparoscopic cholecystectomy to the patient but he would like to try to arrange for elective cholecystectomy in the next few weeks-months. I discussed the risk of recurrent choledocholithiasis, cholecystitis, gallstone pancreatitis, and the patient voiced understanding but would like to go home and see [place] in the office to discuss surgery again.   Patient lives in [place] and I will have our office arrange for him to see Dr. Marlou Starks in our [place] office in the next 2-4 weeks. General surgery will sign off. Call as needed with questions or concerns.  Jill Alexanders, [facility] 05/31/2019, 11:27 AM

## 2019-05-31 NOTE — Progress Notes (Signed)
[facility] Progress Note    Since last GI note: ERCP yesterday, see full report in Epic.  He did well overnight. Feels good this morning.   Objective: Vital signs in last 24 hours: Temp:  [98.4 F (36.9 C)-99.5 F (37.5 C)] 98.4 F (36.9 C) (05/23 0428) Pulse Rate:  [77-91] 77 (05/23 0428) Resp:  [12-21] 18 (05/23 0428) BP: (117-172)/(76-96) 117/76 (05/23 0428) SpO2:  [96 %-100 %] 100 % (05/23 0428) Weight:  [90.3 kg] 90.3 kg (05/22 1208) Last BM Date: 05/30/19 General: alert and oriented times 3 Heart: regular rate and rythm Abdomen: soft, non-tender, non-distended, normal bowel sounds  Lab Results: Recent Labs    05/28/19 2029 05/30/19 0732  WBC 14.8* 11.2*  HGB 17.2* 15.8  PLT 232 194  MCV 87.8 90.8   Recent Labs    05/28/19 2029 05/30/19 0732  NA 139 138  K 3.9 3.4*  CL 102 103  CO2 26 27  GLUCOSE 140* 114*  BUN 9 6  CREATININE 1.09 0.92  CALCIUM 9.1 8.9   Recent Labs    05/28/19 2029 05/30/19 0732  PROT 8.2* 7.1  ALBUMIN 4.4 3.5  AST 154* 77*  ALT 397* 250*  ALKPHOS 152* 135*  BILITOT 4.3* 1.7*   No results for input(s): INR in the last 72 hours.   Studies/Results: DG ERCP BILIARY & PANCREATIC DUCTS  Result Date: 05/30/2019 CLINICAL DATA:  49 year old male with a history of choledocholithiasis. EXAM: ERCP TECHNIQUE: Multiple spot images obtained with the fluoroscopic device and submitted for interpretation post-procedure. FLUOROSCOPY TIME:  Fluoroscopy Time:  2 minutes COMPARISON:  CT 05/29/2019 FINDINGS: Limited images during ERCP. Initial image demonstrates endoscope projecting over the upper abdomen. Safety wire in place within the extrahepatic biliary ducts. There is subsequently retrograde infusion of contrast with partial opacification of the extrahepatic biliary ducts. No significant dilation. IMPRESSION: Limited images during ERCP demonstrates partial opacification of the extrahepatic biliary ducts, not significantly dilated.  Please refer to the dictated operative report for full details of intraoperative findings and procedure. Electronically Signed   By: Gilmer Mor D.O.   On: 05/30/2019 14:33     Medications: Scheduled Meds: Continuous Infusions: . dextrose 5% lactated ringers 125 mL/hr at 05/31/19 0645   PRN Meds:.HYDROmorphone (DILAUDID) injection, ondansetron **OR** ondansetron (ZOFRAN) IV    Assessment/Plan: 49 y.o. male with gallstone pancreatitis, resolved  Clearly no post ERCP worsening of his rather mild pancreatitis. He has several stones in his GB and will need eventual cholecystectomy. He prefers to not have this done while in the hospital and that is probably safe but I recommend at least curb-siding [facility] and ensuring that he has relatively quick surgical follow up if they agree it is safe to wait on his GB.  Please call or page with any further questions or concerns.   Rachael Fee, MD  05/31/2019, 8:54 AM [facility] Pager [phone]

## 2019-05-31 NOTE — Discharge Summary (Signed)
Physician Discharge Summary  Bruce Lindsey WUJ:[id number] DOB: 1970-04-20 DOA: 05/29/2019  PCP: Patient, No Pcp Per  Admit date: 05/29/2019 Discharge date: 05/31/2019  Admitted From: Home Disposition:  Home  Recommendations for Outpatient Follow-up:  1. Follow up with PCP in 2-3 weeks 2. Follow up with General Surgery as scheduled  Discharge Condition:Improved CODE STATUS:Full Diet recommendation: Low fat   Brief/Interim Summary: 49 y.o.malewith medical history significant ofno significant past medical history who presented to [facility] with abdominal pain nausea and vomiting for 2 days. Pain is located in the epigastric region. Sharp. Rated as 9 out of 10. Not relieved by anything except for pain medication in the ER. Associated with bloating. Symptoms are intermittent. Denied any diarrhea. Denied any melena or bright red blood per rectum. No hematemesis. Patient was seen and evaluated. Patient denies alcohol or tobacco use. Lipid panel is currently unknown. Findings: Found acute pancreatitis. Also evidence of gallstones. Lipase more than 5000. Patient is requiring ERCP which is not available over there so patient transferred to this hospital. GI was consulted  Discharge Diagnoses:  Principal Problem:   Acute gallstone pancreatitis Active Problems:   Leucocytosis   Pancreatitis, gallstone   Bile duct stone   #1 acute gallstone pancreatitis: -Presenting lipase of 5577, now much improved to 111 -GI consulted, underwent ERCP on 5/22 with findings worrisome for obstructing stone, now passed -Abd imaging confirms presence of gallstones without cholecystitis -Have consulted General Surgery for possible cholecystectomy. Per General Surgery, pt is not interested in surgery here at this time and would prefer following up as outpatient. -Patient to follow up with General Surgery as scheduled -have advanced diet  #2 leukocytosis: -Likely secondary to acute  pancreatitis.  -WBC trended down  #3Hypokalemia -replaced  #4Elevated LFT's -Likely related to above gallstone pancreatitis -LFT trends had improved   Discharge Instructions   Allergies as of 05/31/2019   No Known Allergies     Medication List    TAKE these medications   acetaminophen 500 MG tablet Commonly known as: TYLENOL Take 1,000 mg by mouth every 6 (six) hours as needed for mild pain or moderate pain.      Follow-up Information    Griselda Miner, MD Follow up.   Specialty: General Surgery Why: Please call to confirm appointment date/time to discuss gallbladder surgery. appointment being scheduled at our [place] office -- [address]. [place], [place] [address]. [phone] Contact information: [address] [place] [address] [phone]        Follow up with your PCP in 2-3 weeks. Schedule an appointment as soon as possible for a visit.          No Known Allergies  Consultations:  GI  General Surgery  Procedures/Studies: CT ABDOMEN PELVIS W CONTRAST  Result Date: 05/29/2019 CLINICAL DATA:  Nausea and vomiting with pancreatitis suspected EXAM: CT ABDOMEN AND PELVIS WITH CONTRAST TECHNIQUE: Multidetector CT imaging of the abdomen and pelvis was performed using the standard protocol following bolus administration of intravenous contrast. CONTRAST:  OMNIPAQUE IOHEXOL 300 MG/ML  SOLN COMPARISON:  05/29/2019 ultrasound FINDINGS: Lower chest:  No contributory findings. Hepatobiliary: No focal liver abnormality.Cholelithiasis. No evidence of acute cholecystitis. Two distal CBD calculi with no ductal dilatation. The larger measures 7 x 3 mm at the pancreatic head. The smaller is punctate and at the level of the punctum. Pancreas: Edema at the root of the small bowel mesentery likely related to pancreatitis, although the pancreatic parenchyma  does not appear expanded or edematous, there is notably elevated lipase. No  collection or necrosis. Spleen: Unremarkable. Adrenals/Urinary Tract: Negative adrenals. No hydronephrosis or stone. Unremarkable bladder. Stomach/Bowel: No obstruction. No appendicitis. Distal colonic diverticulosis. Vascular/Lymphatic: No acute vascular abnormality. Multifocal atherosclerotic calcification. No mass or adenopathy. Reproductive:No pathologic findings. Other: No ascites or pneumoperitoneum. Musculoskeletal: No acute abnormalities. IMPRESSION: Cholelithiasis and choledocholithiasis with edema in the subperitoneal fat presumably related to the known pancreatitis. Electronically Signed   By: Marnee Spring M.D.   On: 05/29/2019 04:38   DG ERCP BILIARY & PANCREATIC DUCTS  Result Date: 05/30/2019 CLINICAL DATA:  49 year old male with a history of choledocholithiasis. EXAM: ERCP TECHNIQUE: Multiple spot images obtained with the fluoroscopic device and submitted for interpretation post-procedure. FLUOROSCOPY TIME:  Fluoroscopy Time:  2 minutes COMPARISON:  CT 05/29/2019 FINDINGS: Limited images during ERCP. Initial image demonstrates endoscope projecting over the upper abdomen. Safety wire in place within the extrahepatic biliary ducts. There is subsequently retrograde infusion of contrast with partial opacification of the extrahepatic biliary ducts. No significant dilation. IMPRESSION: Limited images during ERCP demonstrates partial opacification of the extrahepatic biliary ducts, not significantly dilated. Please refer to the dictated operative report for full details of intraoperative findings and procedure. Electronically Signed   By: Gilmer Mor D.O.   On: 05/30/2019 14:33   US ABDOMEN LIMITED RUQ  Result Date: 05/29/2019 CLINICAL DATA:  Initial evaluation for acute abdominal pain, nausea, vomiting, elevated LFTs, pancreatitis. EXAM: ULTRASOUND ABDOMEN LIMITED RIGHT UPPER QUADRANT COMPARISON:  None. FINDINGS: Gallbladder: Multiple echogenic stones seen within the gallbladder lumen, largest  of which measures 7 mm. Gallbladder wall measures within normal limits at 2.1 mm. No free pericholecystic fluid. No sonographic Murphy sign elicited on exam. Common bile duct: Diameter: 3.7 mm Liver: No focal lesion identified. Within normal limits in parenchymal echogenicity. Portal vein is patent on color Doppler imaging with normal direction of blood flow towards the liver. Other: None. IMPRESSION: 1. Cholelithiasis. No sonographic features to suggest acute cholecystitis. 2. No biliary dilatation. Electronically Signed   By: Rise Mu M.D.   On: 05/29/2019 03:26     Subjective: Eager to go home today  Discharge Exam: Vitals:   05/30/19 2002 05/31/19 0428  BP: 130/82 117/76  Pulse: 89 77  Resp: 18 18  Temp: 99.5 F (37.5 C) 98.4 F (36.9 C)  SpO2: 98% 100%   Vitals:   05/30/19 1406 05/30/19 1750 05/30/19 2002 05/31/19 0428  BP: (!) 143/92 (!) 148/89 130/82 117/76  Pulse: 78 83 89 77  Resp: 18 18 18 18   Temp: 98.6 F (37 C) 98.9 F (37.2 C) 99.5 F (37.5 C) 98.4 F (36.9 C)  TempSrc: Oral Oral Oral Oral  SpO2: 98% 96% 98% 100%  Weight:      Height:        General: Pt is alert, awake, not in acute distress Cardiovascular: RRR, S1/S2 +, no rubs, no gallops Respiratory: CTA bilaterally, no wheezing, no rhonchi Abdominal: Soft, NT, ND, bowel sounds + Extremities: no edema, no cyanosis   The results of significant diagnostics from this hospitalization (including imaging, microbiology, ancillary and laboratory) are listed below for reference.     Microbiology: Recent Results (from the past 240 hour(s))  SARS Coronavirus 2 by RT PCR (hospital order, performed in [facility] hospital lab) Nasopharyngeal Nasopharyngeal Swab     Status: None   Collection Time: 05/29/19  5:59 AM   Specimen: Nasopharyngeal Swab  Result Value Ref Range Status   SARS  Coronavirus 2 NEGATIVE NEGATIVE Final    Comment: (NOTE) SARS-CoV-2 target nucleic acids are NOT DETECTED. The  SARS-CoV-2 RNA is generally detectable in upper and lower respiratory specimens during the acute phase of infection. The lowest concentration of SARS-CoV-2 viral copies this assay can detect is 250 copies / mL. A negative result does not preclude SARS-CoV-2 infection and should not be used as the sole basis for treatment or other patient management decisions.  A negative result may occur with improper specimen collection / handling, submission of specimen other than nasopharyngeal swab, presence of viral mutation(s) within the areas targeted by this assay, and inadequate number of viral copies (<250 copies / mL). A negative result must be combined with clinical observations, patient history, and epidemiological information. Fact Sheet for Patients:   StrictlyIdeas.no Fact Sheet for Healthcare Providers: BankingDealers.co.za This test is not yet approved or cleared  by the [place] FDA and has been authorized for detection and/or diagnosis of SARS-CoV-2 by FDA under an Emergency Use Authorization (EUA).  This EUA will remain in effect (meaning this test can be used) for the duration of the COVID-19 declaration under Section 564(b)(1) of the Act, 21 U.S.C. section 360bbb-3(b)(1), unless the authorization is terminated or revoked sooner. Performed at [facility], [address] [place], [place] [address]      Labs: BNP (last 3 results) No results for input(s): BNP in the last 8760 hours. Basic Metabolic Panel: Recent Labs  Lab 05/28/19 2029 05/30/19 0732 05/31/19 0754  NA 139 138 140  K 3.9 3.4* 3.8  CL 102 103 107  CO2 26 27 27   GLUCOSE 140* 114* 117*  BUN 9 6 <5*  CREATININE 1.09 0.92 0.96  CALCIUM 9.1 8.9 8.8*   Liver Function Tests: Recent Labs  Lab 05/28/19 2029 05/30/19 0732 05/31/19 0754  AST 154* 77* 53*  ALT 397* 250* 188*  ALKPHOS 152* 135* 99  BILITOT 4.3* 1.7* 1.4*  PROT 8.2* 7.1 6.2*   ALBUMIN 4.4 3.5 2.9*   Recent Labs  Lab 05/28/19 2029 05/30/19 0732  LIPASE 5,577* 111*   No results for input(s): AMMONIA in the last 168 hours. CBC: Recent Labs  Lab 05/28/19 2029 05/30/19 0732  WBC 14.8* 11.2*  HGB 17.2* 15.8  HCT 51.0 48.1  MCV 87.8 90.8  PLT 232 194   Cardiac Enzymes: No results for input(s): CKTOTAL, CKMB, CKMBINDEX, TROPONINI in the last 168 hours. BNP: Invalid input(s): POCBNP CBG: No results for input(s): GLUCAP in the last 168 hours. D-Dimer No results for input(s): DDIMER in the last 72 hours. Hgb A1c No results for input(s): HGBA1C in the last 72 hours. Lipid Profile No results for input(s): CHOL, HDL, LDLCALC, TRIG, CHOLHDL, LDLDIRECT in the last 72 hours. Thyroid function studies No results for input(s): TSH, T4TOTAL, T3FREE, THYROIDAB in the last 72 hours.  Invalid input(s): FREET3 Anemia work up No results for input(s): VITAMINB12, FOLATE, FERRITIN, TIBC, IRON, RETICCTPCT in the last 72 hours. Urinalysis    Component Value Date/Time   COLORURINE YELLOW (A) 05/28/2019 2029   APPEARANCEUR CLEAR (A) 05/28/2019 2029   LABSPEC 1.008 05/28/2019 2029   PHURINE 5.0 05/28/2019 2029   GLUCOSEU NEGATIVE 05/28/2019 2029   HGBUR SMALL (A) 05/28/2019 2029   BILIRUBINUR NEGATIVE 05/28/2019 2029   KETONESUR 20 (A) 05/28/2019 2029   PROTEINUR NEGATIVE 05/28/2019 2029   NITRITE NEGATIVE 05/28/2019 2029   LEUKOCYTESUR NEGATIVE 05/28/2019 2029   Sepsis Labs Invalid input(s): PROCALCITONIN,  WBC,  LACTICIDVEN Microbiology Recent Results (  from the past 240 hour(s))  SARS Coronavirus 2 by RT PCR (hospital order, performed in [facility] hospital lab) Nasopharyngeal Nasopharyngeal Swab     Status: None   Collection Time: 05/29/19  5:59 AM   Specimen: Nasopharyngeal Swab  Result Value Ref Range Status   SARS Coronavirus 2 NEGATIVE NEGATIVE Final    Comment: (NOTE) SARS-CoV-2 target nucleic acids are NOT DETECTED. The SARS-CoV-2 RNA is  generally detectable in upper and lower respiratory specimens during the acute phase of infection. The lowest concentration of SARS-CoV-2 viral copies this assay can detect is 250 copies / mL. A negative result does not preclude SARS-CoV-2 infection and should not be used as the sole basis for treatment or other patient management decisions.  A negative result may occur with improper specimen collection / handling, submission of specimen other than nasopharyngeal swab, presence of viral mutation(s) within the areas targeted by this assay, and inadequate number of viral copies (<250 copies / mL). A negative result must be combined with clinical observations, patient history, and epidemiological information. Fact Sheet for Patients:   BoilerBrush.com.cy Fact Sheet for Healthcare Providers: https://pope.com/ This test is not yet approved or cleared  by the [place] FDA and has been authorized for detection and/or diagnosis of SARS-CoV-2 by FDA under an Emergency Use Authorization (EUA).  This EUA will remain in effect (meaning this test can be used) for the duration of the COVID-19 declaration under Section 564(b)(1) of the Act, 21 U.S.C. section 360bbb-3(b)(1), unless the authorization is terminated or revoked sooner. Performed at [facility], [address], [place], [place] [address]    Time spent: 30 min  SIGNED:   Rickey Barbara, MD  [facility] 05/31/2019, 11:45 AM  If 7PM-7AM, please contact night-coverage

## 2019-05-31 NOTE — Plan of Care (Signed)

## 2019-05-31 NOTE — Progress Notes (Signed)
AVS given and reviewed with pt. Medications discussed. All questions answered to satisfaction. Pt verbalized understanding of information given. Pt chose to walk off the unit accompanied by staff member.

## 2021-11-03 IMAGING — CT CT ABD-PELV W/ CM
2 of 5 series · 16 of 46 positions shown, 18 images · IV contrast (APPLIED)
Comparison: 05/29/2019 ultrasound

CLINICAL DATA: Nausea and vomiting with pancreatitis suspected

EXAM:
CT ABDOMEN AND PELVIS WITH CONTRAST
TECHNIQUE: Multidetector CT imaging of the abdomen and pelvis was performed
using the standard protocol following bolus administration of
intravenous contrast.
CONTRAST:  100mL OMNIPAQUE IOHEXOL 300 MG/ML  SOLN

[Series 2: routine abd/pel with · axial · 0.82mm/px · z∈[-1107,-647]mm · 13 of 104 slices shown, 15 images]
[im 6/104  soft-tissue]
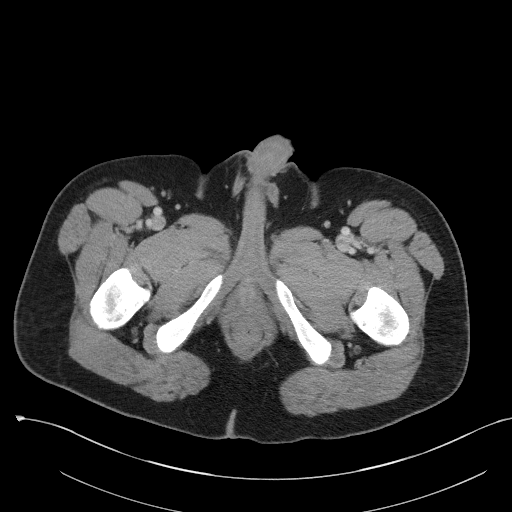
[im 6/104  bone]
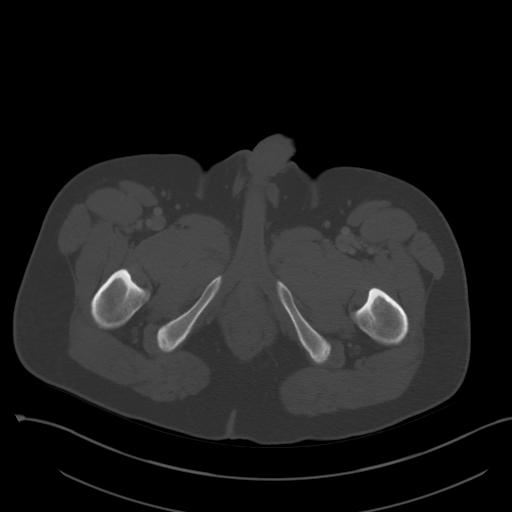
[im 17/104  soft-tissue]
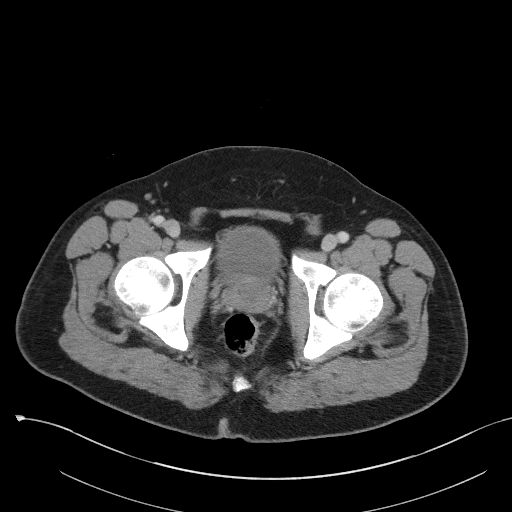
[im 22/104  soft-tissue]
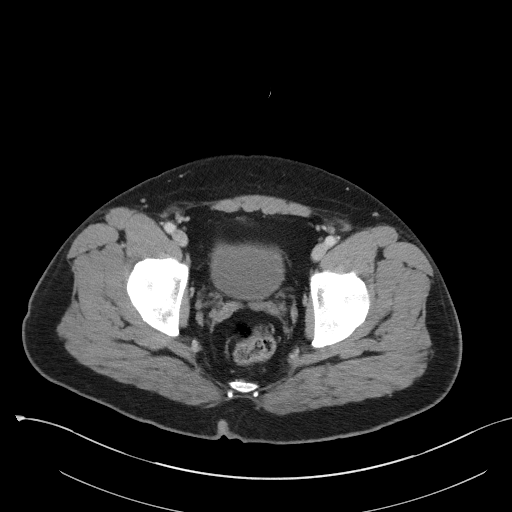
[im 28/104  soft-tissue]
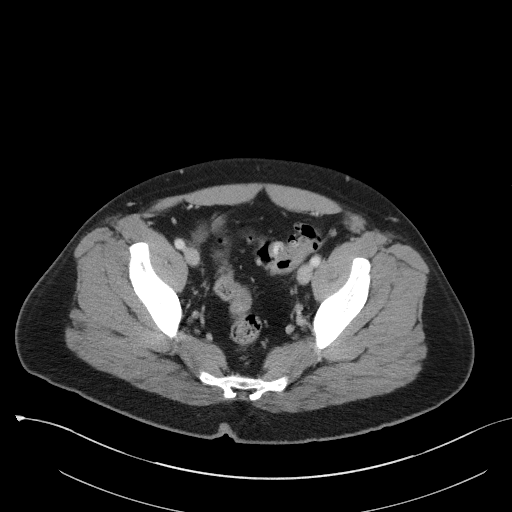
[im 38/104  soft-tissue]
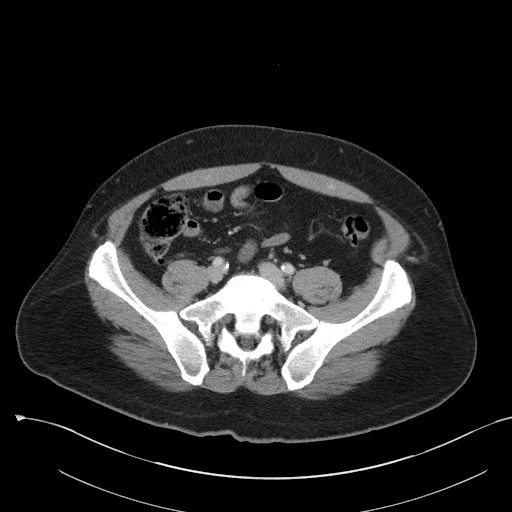
[im 44/104  soft-tissue]
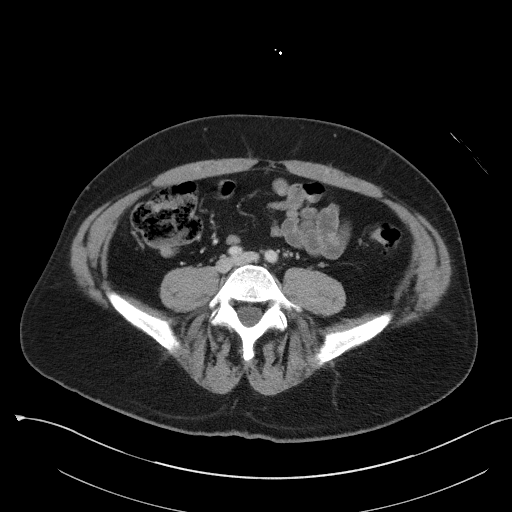
[im 55/104  soft-tissue]
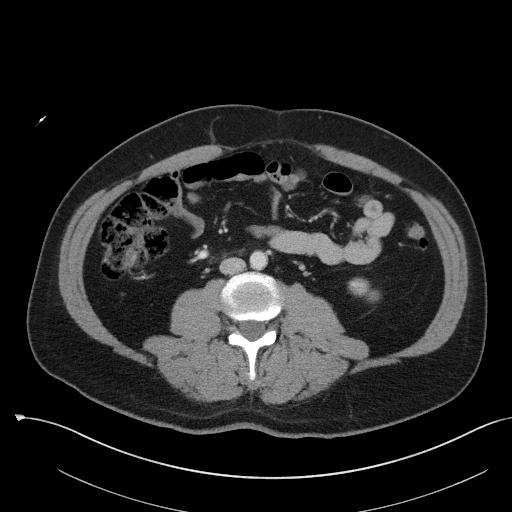
[im 60/104  soft-tissue]
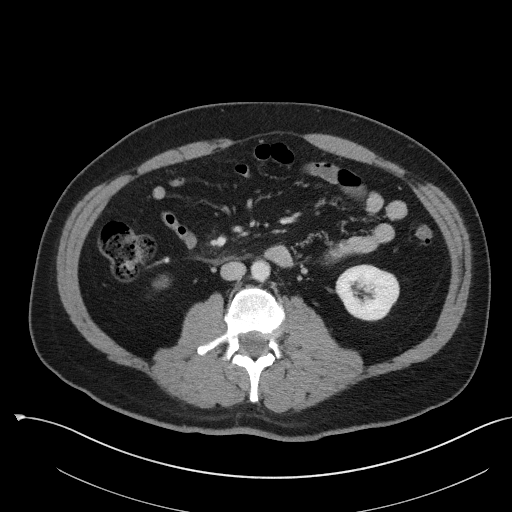
[im 66/104  soft-tissue]
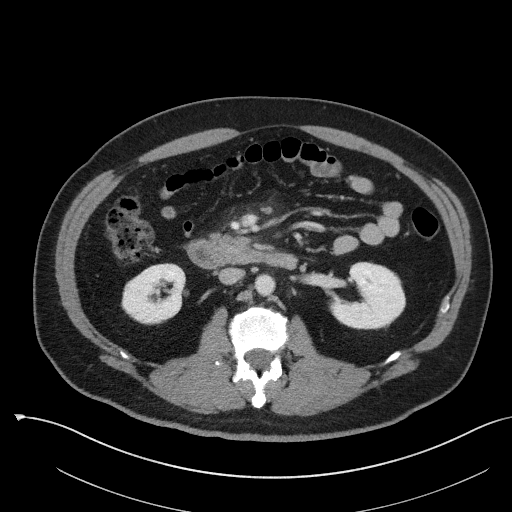
[im 66/104  bone]
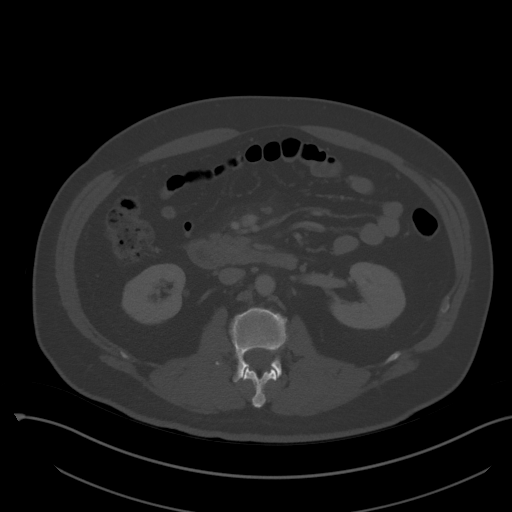
[im 76/104  soft-tissue]
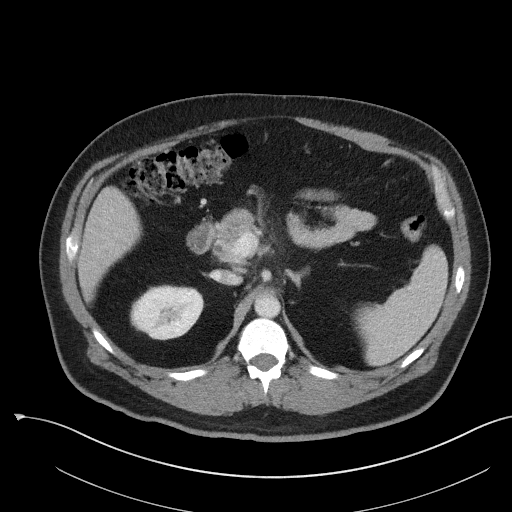
[im 82/104  soft-tissue]
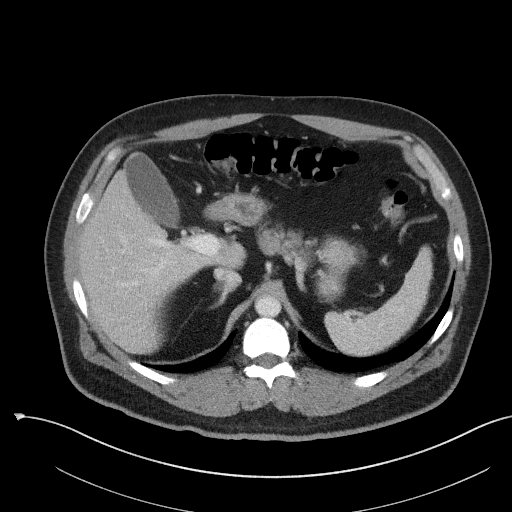
[im 87/104  soft-tissue]
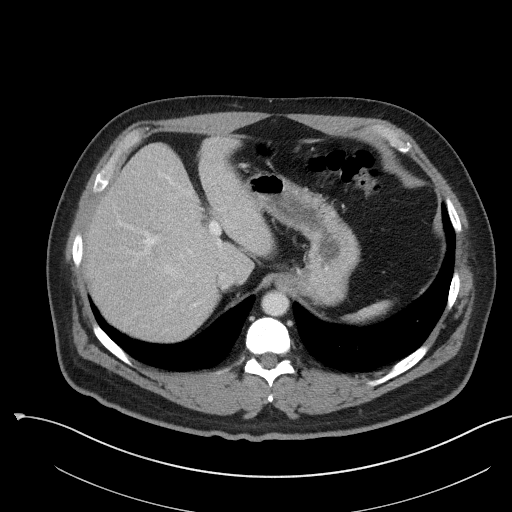
[im 98/104  soft-tissue]
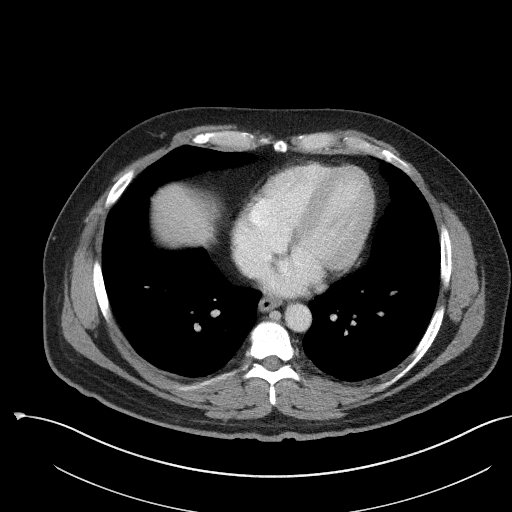

[Series 5: coronal st · coronal · 0.75mm/px · 3 of 84 slices shown]
[im 28/84  soft-tissue]
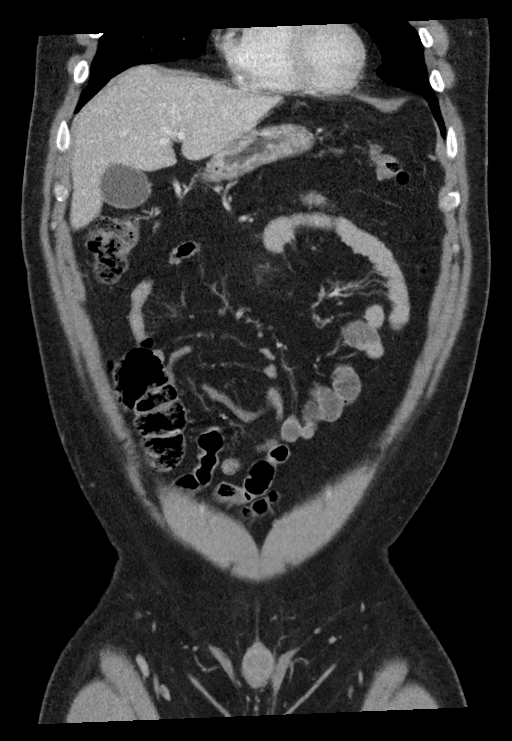
[im 37/84  soft-tissue]
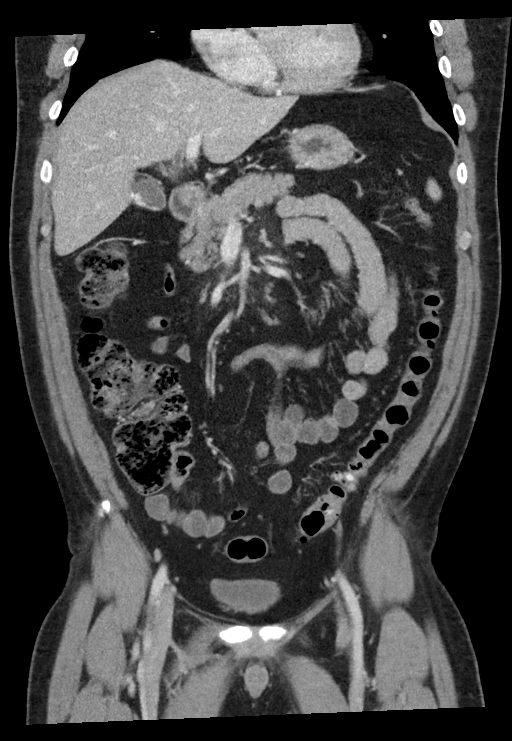
[im 47/84  soft-tissue]
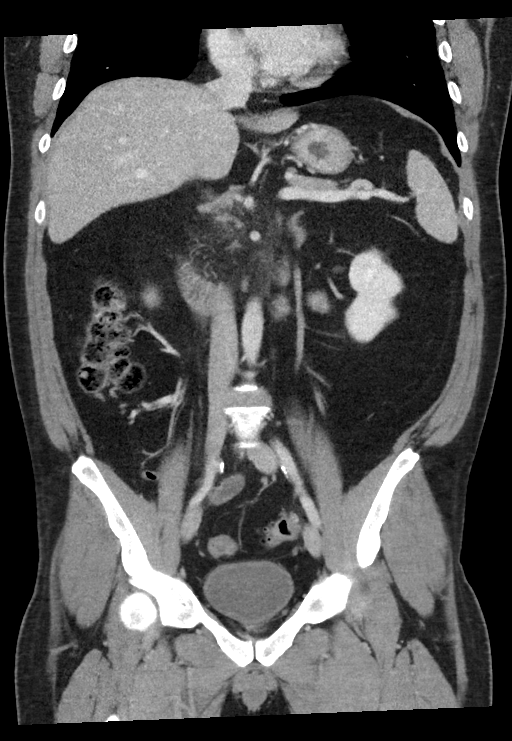

[16 of 46 positions shown; findings below may reference images not displayed]

FINDINGS: Lower chest:  No contributory findings.

Hepatobiliary: No focal liver abnormality.Cholelithiasis. No
evidence of acute cholecystitis. Two distal CBD calculi with no
ductal dilatation. The larger measures 7 x 3 mm at the pancreatic
head. The smaller is punctate and at the level of the punctum.

Pancreas: Edema at the root of the small bowel mesentery likely
related to pancreatitis, although the pancreatic parenchyma does not
appear expanded or edematous, there is notably elevated lipase. No
collection or necrosis.

Spleen: Unremarkable.

Adrenals/Urinary Tract: Negative adrenals. No hydronephrosis or
stone. Unremarkable bladder.

Stomach/Bowel: No obstruction. No appendicitis. Distal colonic
diverticulosis.

Vascular/Lymphatic: No acute vascular abnormality. Multifocal
atherosclerotic calcification. No mass or adenopathy.

Reproductive:No pathologic findings.

Other: No ascites or pneumoperitoneum.

Musculoskeletal: No acute abnormalities.
IMPRESSION: Cholelithiasis and choledocholithiasis with edema in the
subperitoneal fat presumably related to the known pancreatitis.
